# Patient Record
Sex: Male | Born: 1955 | Race: White | Hispanic: No | Marital: Married | Smoking: Current every day smoker
Health system: Southern US, Community
[De-identification: ages and names within clinical notes are randomized; demographics above are authoritative.]

## PROBLEM LIST (undated history)

## (undated) DIAGNOSIS — M797 Fibromyalgia: Secondary | ICD-10-CM

## (undated) DIAGNOSIS — D1800 Hemangioma unspecified site: Secondary | ICD-10-CM

## (undated) DIAGNOSIS — E7800 Pure hypercholesterolemia, unspecified: Secondary | ICD-10-CM

## (undated) DIAGNOSIS — I10 Essential (primary) hypertension: Secondary | ICD-10-CM

## (undated) HISTORY — PX: FACIAL COSMETIC SURGERY: SHX629

---

## 1998-08-24 ENCOUNTER — Emergency Department (HOSPITAL_COMMUNITY): Admission: EM | Admit: 1998-08-24 | Discharge: 1998-08-24 | Payer: Self-pay | Admitting: Emergency Medicine

## 2002-09-27 ENCOUNTER — Inpatient Hospital Stay (HOSPITAL_COMMUNITY): Admission: AD | Admit: 2002-09-27 | Discharge: 2002-09-29 | Payer: Self-pay | Admitting: Orthopedic Surgery

## 2005-08-18 ENCOUNTER — Emergency Department (HOSPITAL_COMMUNITY): Admission: EM | Admit: 2005-08-18 | Discharge: 2005-08-18 | Payer: Self-pay | Admitting: Emergency Medicine

## 2009-10-13 ENCOUNTER — Emergency Department (HOSPITAL_COMMUNITY): Admission: EM | Admit: 2009-10-13 | Discharge: 2009-10-13 | Payer: Self-pay | Admitting: Emergency Medicine

## 2010-10-03 NOTE — Discharge Summary (Signed)
NAME:  Jay Kelly, Jay Kelly                          ACCOUNT NO.:  [id number]   MEDICAL RECORD NO.:  [id number]                   PATIENT TYPE:  INP   LOCATION:  5017                                 FACILITY:  [facility]   PHYSICIAN:  Dionne Ano. Amanda Pea, M.D.             DATE OF BIRTH:  1956-03-12   DATE OF ADMISSION:  09/27/2002  DATE OF DISCHARGE:  09/29/2002                                 DISCHARGE SUMMARY   ADMITTING DIAGNOSES:  1. Olecranon bursitis with superimposed cellulitis, about the right upper     extremity.  2. Seasonal allergies.   PHYSICIAN:  Dionne Ano. Amanda Pea, M.D.   CONSULTS:  None.   BRIEF HPI:  Jay Kelly is a 55 year old gentleman who was seen and evaluated  per Dr. Amanda Pea for right elbow pain and redness.  The patient had no known  instance of injury and was previously seen by Dr. Georgina Pillion on 5/9, in which an  aspiration was performed and with which the patient developed worsening  erythema, soft tissue swelling, and pain.  The patient had been taking  Augmentin and Vicodin up until that juncture.  Given the fact that the  patient had unresolving cellulitis and pain about the right elbow, the plan  was to have him be admitted for IV antibiotics, close observation, and  possible I&D.   PREADMISSION LABORATORY DATA:  His hemoglobin was 13.4, hematocrit 38.3.  No  leukocytosis noted.  BMP within normal limits.   HOSPITAL COURSE:  Patient was admitted for IV antibiotics, close  observation, and possible I&D, if his symptoms progressed.  Hospital day  number 1, the patient did have significant pain and tenderness, he was noted  to have pruritis as well as episodes of nausea and vomiting.  His pain  medications, which had originally been given to him as Percocet was  discontinued.  Vicodin was begun, and his nausea and vomiting did improve.  Examination of the right upper extremity showed that the cellulitis was  improving.  He was still quite tender.  He was  neurovascularly intact.  Vital signs were stable ad afebrile.  Over the course of the next few days,  the patient continued IV antibiotics.  His condition improved.  He had  decreasing erythema to the right elbow.  His pain improved significantly.  The patient maintained afebrile status.  Vitals were stable on 09/29/2002.  Patient was noted to have decreased erythema, only warm to touch, with a  prominent bursa without abscess formation, minimally tender.   Discharge plans were discussed with the patient and he stated he had to  leave one way or another the following morning, as he needed to pick his  daughter up in [place].  Given his improved condition, the decision was made  to discharge him home.   ASSESSMENT:  olecranon bursitis with cellulitis, resolved of the right upper  extremity.   CONDITION ON DISCHARGE:  Improved.  PLAN:  Regular activity.  Patient will elevate the extremity frequently  utilizing an ACE wrap and warm, moist heat.  He is to move his fingers  frequently.  He may move the elbow as tolerated.   DISCHARGE MEDICATIONS:  Vicodin 1 tab p.o. q.4-6 p.r.n., Keflex 500 mg  q.i.d. x10 days (#40).  He will follow up with Dr. Amanda Pea the following  Thursday or Friday.  He is to call for an appointment.   PLAN:     Karie Chimera, P.A.-C.                   Dionne Ano. Amanda Pea, M.D.    BB/MEDQ  D:  11/02/2002  T:  11/04/2002  Job:  [id number]

## 2017-10-17 ENCOUNTER — Emergency Department (HOSPITAL_COMMUNITY)
Admission: EM | Admit: 2017-10-17 | Discharge: 2017-10-17 | Disposition: A | Discharge status: Home / Self Care | Payer: Medicaid Other | Attending: Emergency Medicine | Admitting: Emergency Medicine

## 2017-10-17 ENCOUNTER — Encounter (HOSPITAL_COMMUNITY): Payer: Self-pay | Admitting: Emergency Medicine

## 2017-10-17 ENCOUNTER — Other Ambulatory Visit: Payer: Self-pay

## 2017-10-17 ENCOUNTER — Emergency Department (HOSPITAL_COMMUNITY): Payer: Medicaid Other

## 2017-10-17 DIAGNOSIS — Z79899 Other long term (current) drug therapy: Secondary | ICD-10-CM | POA: Diagnosis not present

## 2017-10-17 DIAGNOSIS — R571 Hypovolemic shock: Secondary | ICD-10-CM

## 2017-10-17 DIAGNOSIS — E860 Dehydration: Secondary | ICD-10-CM | POA: Diagnosis not present

## 2017-10-17 DIAGNOSIS — R55 Syncope and collapse: Secondary | ICD-10-CM | POA: Diagnosis present

## 2017-10-17 DIAGNOSIS — F17200 Nicotine dependence, unspecified, uncomplicated: Secondary | ICD-10-CM | POA: Insufficient documentation

## 2017-10-17 LAB — COMPREHENSIVE METABOLIC PANEL
ALK PHOS: 53 U/L (ref 38–126)
ALT: 22 U/L (ref 17–63)
ANION GAP: 8 (ref 5–15)
AST: 24 U/L (ref 15–41)
Albumin: 3.8 g/dL (ref 3.5–5.0)
BILIRUBIN TOTAL: 0.3 mg/dL (ref 0.3–1.2)
BUN: 53 mg/dL — ABNORMAL HIGH (ref 6–20)
CALCIUM: 9.1 mg/dL (ref 8.9–10.3)
CO2: 20 mmol/L — ABNORMAL LOW (ref 22–32)
Chloride: 108 mmol/L (ref 101–111)
Creatinine, Ser: 2.4 mg/dL — ABNORMAL HIGH (ref 0.61–1.24)
GFR, EST AFRICAN AMERICAN: 32 mL/min — AB (ref >60)
GFR, EST NON AFRICAN AMERICAN: 28 mL/min — AB (ref >60)
GLUCOSE: 88 mg/dL (ref 65–99)
Potassium: 5.7 mmol/L — ABNORMAL HIGH (ref 3.5–5.1)
Sodium: 136 mmol/L (ref 135–145)
TOTAL PROTEIN: 6.8 g/dL (ref 6.5–8.1)

## 2017-10-17 LAB — URINALYSIS, ROUTINE W REFLEX MICROSCOPIC
BILIRUBIN URINE: NEGATIVE
Glucose, UA: 50 mg/dL — AB
Ketones, ur: NEGATIVE mg/dL
Leukocytes, UA: NEGATIVE
Nitrite: NEGATIVE
PH: 5 (ref 5.0–8.0)
Protein, ur: NEGATIVE mg/dL
SPECIFIC GRAVITY, URINE: 1.008 (ref 1.005–1.030)

## 2017-10-17 LAB — CBC WITH DIFFERENTIAL/PLATELET
Abs Immature Granulocytes: 0.1 10*3/uL (ref 0.0–0.1)
BASOS ABS: 0.1 10*3/uL (ref 0.0–0.1)
BASOS PCT: 1 %
Eosinophils Absolute: 0.5 10*3/uL (ref 0.0–0.7)
Eosinophils Relative: 5 %
HCT: 40.2 % (ref 39.0–52.0)
Hemoglobin: 13 g/dL (ref 13.0–17.0)
Immature Granulocytes: 1 %
Lymphocytes Relative: 22 %
Lymphs Abs: 2.2 10*3/uL (ref 0.7–4.0)
MCH: 31.1 pg (ref 26.0–34.0)
MCHC: 32.3 g/dL (ref 30.0–36.0)
MCV: 96.2 fL (ref 78.0–100.0)
MONO ABS: 0.7 10*3/uL (ref 0.1–1.0)
Monocytes Relative: 7 %
NEUTROS PCT: 64 %
Neutro Abs: 6.7 10*3/uL (ref 1.7–7.7)
PLATELETS: 382 10*3/uL (ref 150–400)
RBC: 4.18 MIL/uL — AB (ref 4.22–5.81)
RDW: 13.3 % (ref 11.5–15.5)
WBC: 10.3 10*3/uL (ref 4.0–10.5)

## 2017-10-17 LAB — ETHANOL: ALCOHOL ETHYL (B): 165 mg/dL — AB (ref <10)

## 2017-10-17 LAB — I-STAT CG4 LACTIC ACID, ED: Lactic Acid, Venous: 0.93 mmol/L (ref 0.5–1.9)

## 2017-10-17 MED ORDER — SODIUM CHLORIDE 0.9 % IV BOLUS
2000.0000 mL | Freq: Once | INTRAVENOUS | Status: AC
  Administered 2017-10-17: 2000 mL via INTRAVENOUS

## 2017-10-17 NOTE — ED Provider Notes (Signed)
[facility] EMERGENC[facility] DEPARTMENT Provider Note   CSN: [id number] Arrival date & time: 10/17/17  1413     History   Chief Complaint Chief Complaint  Patient presents with  . Near Syncope  . Hypotension    HPI Jay Kelly is a 62 y.o. male.  62 yo M with a chief complaint of near syncope.  The patient has been helping to clear trees for the past couple days and is felt lightheaded.  He denied chest pain or shortness of breath.  Has had a mild cough but denies fevers or chills vomiting or diarrhea.  He thinks he has had some blood in his stool intermittently for years not worsening recently.  He does drink alcohol every day.  The history is provided by the patient.  Illness  This is a new problem. The current episode started 2 days ago. The problem occurs constantly. The problem has not changed since onset.Pertinent negatives include no chest pain, no abdominal pain, no headaches and no shortness of breath. Nothing aggravates the symptoms. Nothing relieves the symptoms. He has tried nothing for the symptoms. The treatment provided no relief.    History reviewed. No pertinent past medical history.  There are no active problems to display for this patient.   History reviewed. No pertinent surgical history.      Home Medications    Prior to Admission medications   Medication Sig Start Date End Date Taking? Authorizing Provider  cyclobenzaprine (FLEXERIL) 10 MG tablet Take 10 mg by mouth 3 (three) times daily as needed for muscle spasms.   Yes [provider]  DULoxetine (CYMBALTA) 60 MG capsule Take 60 mg by mouth 2 (two) times daily. 08/18/17  Yes [provider]  gabapentin (NEURONTIN) 600 MG tablet Take 600 mg by mouth 4 (four) times daily.   Yes [provider]  gemfibrozil (LOPID) 600 MG tablet Take 600 mg by mouth 2 (two) times daily before a meal.   Yes [provider]  lisinopril (PRINIVIL,ZESTRIL) 20 MG tablet Take  20 mg by mouth daily. 09/27/17  Yes [provider]  meloxicam (MOBIC) 15 MG tablet Take 15 mg by mouth daily. 09/27/17  Yes [provider]  tiZANidine (ZANAFLEX) 4 MG tablet Take 4 mg by mouth every 8 (eight) hours as needed for muscle spasms.   Yes [provider]  traMADol (ULTRAM) 50 MG tablet Take 50-100 mg by mouth 2 (two) times daily as needed for moderate pain.   Yes [provider]    Family History No family history on file.  Social History Social History   Tobacco Use  . Smoking status: Current Every Day Smoker    Packs/day: 0.50  . Smokeless tobacco: Never Used  Substance Use Topics  . Alcohol use: Yes  . Drug use: Never     Allergies   Patient has no allergy information on record.   Review of Systems Review of Systems  Constitutional: Negative for chills and fever.  HENT: Negative for congestion and facial swelling.   Eyes: Negative for discharge and visual disturbance.  Respiratory: Negative for shortness of breath.   Cardiovascular: Negative for chest pain and palpitations.  Gastrointestinal: Negative for abdominal pain, diarrhea and vomiting.  Musculoskeletal: Negative for arthralgias and myalgias.  Skin: Negative for color change and rash.  Neurological: Positive for syncope (near) and weakness. Negative for tremors and headaches.  Psychiatric/Behavioral: Negative for confusion and dysphoric mood.     Physical Exam Updated Vital  Signs BP 116/79 (BP Location: Right Arm)   Pulse 70   Temp 97.6 F (36.4 C) (Oral)   Resp 17   Ht 5\' 7"  (1.702 m)   Wt 64.4 kg (142 lb)   SpO2 95%   BMI 22.24 kg/m   Physical Exam  Constitutional: He is oriented to person, place, and time. He appears well-developed and well-nourished.  HENT:  Head: Normocephalic and atraumatic.  Eyes: Pupils are equal, round, and reactive to light. EOM are normal.  Neck: Normal range of motion. Neck supple. No JVD present.  Cardiovascular: Normal  rate and regular rhythm. Exam reveals no gallop and no friction rub.  No murmur heard. Pulmonary/Chest: No respiratory distress. He has no wheezes.  Abdominal: He exhibits no distension and no mass. There is no tenderness. There is no rebound and no guarding.  Musculoskeletal: Normal range of motion.  Neurological: He is alert and oriented to person, place, and time.  Skin: No rash noted. No pallor.  Psychiatric: He has a normal mood and affect. His behavior is normal.  Nursing note and vitals reviewed.    ED Treatments / Results  Labs (all labs ordered are listed, but only abnormal results are displayed) Labs Reviewed  ETHANOL - Abnormal; Notable for the following components:      Result Value   Alcohol, Ethyl (B) 165 (*)    All other components within normal limits  COMPREHENSIVE METABOLIC PANEL - Abnormal; Notable for the following components:   Potassium 5.7 (*)    CO2 20 (*)    BUN 53 (*)    Creatinine, Ser 2.40 (*)    GFR calc non Af Amer 28 (*)    GFR calc Af Amer 32 (*)    All other components within normal limits  CBC WITH DIFFERENTIAL/PLATELET - Abnormal; Notable for the following components:   RBC 4.18 (*)    All other components within normal limits  URINALYSIS, ROUTINE W REFLEX MICROSCOPIC - Abnormal; Notable for the following components:   Color, Urine STRAW (*)    Glucose, UA 50 (*)    Hgb urine dipstick SMALL (*)    Bacteria, UA RARE (*)    All other components within normal limits  URINE CULTURE  I-STAT CG4 LACTIC ACID, ED  I-STAT CG4 LACTIC ACID, ED    EKG EKG Interpretation  Date/Time:  Sunday October 17 2017 14:26:21 EDT Ventricular Rate:  58 PR Interval:  142 QRS Duration: 82 QT Interval:  414 QTC Calculation: 406 R Axis:   94 Text Interpretation:  Sinus bradycardia Possible Left atrial enlargement Rightward axis Borderline ECG No old tracing to compare Confirmed by Deno Etienne [id number]) on 10/17/2017 3:08:57 PM   Radiology Dg Chest 2  View  Result Date: 10/17/2017 CLINICAL DATA:  61 year old presenting with dizziness and productive cough over the past several days. Current smoker. EXAM: CHEST - 2 VIEW COMPARISON:  None. FINDINGS: AP SEMI-ERECT and LATERAL images were obtained. Cardiomediastinal silhouette unremarkable. Lungs clear. Bronchovascular markings normal. Pulmonary vascularity normal. No visible pleural effusions. No pneumothorax. Visualized bony thorax intact. IMPRESSION: No acute cardiopulmonary disease. Electronically Signed   By: Evangeline Dakin M.D.   On: 10/17/2017 17:49    Procedures Procedures (including critical care time) Discussed smoking cessation with patient and was they were offerred resources to help stop.  Total time was 5 min CPT code 99406.   Medications Ordered in ED Medications  sodium chloride 0.9 % bolus 2,000 mL (2,000 mLs Intravenous New Bag/Given 10/17/17 1714)  Initial Impression / Assessment and Plan / ED Course  I have reviewed the triage vital signs and the nursing notes.  Pertinent labs & imaging results that were available during my care of the patient were reviewed by me and considered in my medical decision making (see chart for details).     62 yo M with a chief complaint of dizziness upon standing.  The patient was found to have a blood pressure in the 80s.  Was given some fluid in route.  He feels that he is much better and was leaving AMA but was talked back into staying by his family.  Evaluation here consistent with likely dehydration with a creatinine of 2.4 and a BUN of 53.  Patient has no prior labs for comparison.  His potassium is 5.7 EKG without any concerning findings.  I discussed the results with the patient who is feeling much better after 2 L of IV fluids.  I discussed the possibility of staying in the hospital for further hydration and evaluation for possible kidney injury.  The patient currently is declining is electing to go home and follow-up with his PCP.  He  will call her tomorrow morning.  Discharge home.  CRITICAL CARE Performed by: Cecilio Asper   Total critical care time: 35 minutes  Critical care time was exclusive of separately billable procedures and treating other patients.  Critical care was necessary to treat or prevent imminent or life-threatening deterioration.  Critical care was time spent personally by me on the following activities: development of treatment plan with patient and/or surrogate as well as nursing, discussions with consultants, evaluation of patient's response to treatment, examination of patient, obtaining history from patient or surrogate, ordering and performing treatments and interventions, ordering and review of laboratory studies, ordering and review of radiographic studies, pulse oximetry and re-evaluation of patient's condition.  The patients results and plan were reviewed and discussed.   Any x-rays performed were independently reviewed by myself.   Differential diagnosis were considered with the presenting HPI.  Medications  sodium chloride 0.9 % bolus 2,000 mL (2,000 mLs Intravenous New Bag/Given 10/17/17 1714)    Vitals:   10/17/17 1431 10/17/17 1434 10/17/17 1716 10/17/17 1809  BP:  (!) 80/58 109/68 116/79  Pulse:  (!) 58 68 70  Resp:  18 16 17   Temp:  97.6 F (36.4 C)    TempSrc:  Oral    SpO2:  100% 96% 95%  Weight: 64.4 kg (142 lb)     Height: 5\' 7"  (1.702 m)       Final diagnoses:  Dehydration  Hypovolemic shock (HCC)    Admission/ observation were discussed with the admitting physician, patient and/or family and they are comfortable with the plan.    Final Clinical Impressions(s) / ED Diagnoses   Final diagnoses:  Dehydration  Hypovolemic shock [facility])    ED Discharge Orders    None       Deno Etienne, DO 10/17/17 1815

## 2017-10-17 NOTE — ED Triage Notes (Signed)
Pt to ED via [facility] from home-- daughter called EMS because pt c/o being dizzy for a few days and feeling faint. Pt was orthostatic for ems --  Sitting BP 73/43  Standing  -- BP 60/33 Pt has been drinking etoh - continued drinking when EMS was present and was doing his EkG - pt states that he does not drink everyday.

## 2017-10-17 NOTE — ED Notes (Signed)
Pt states that he "is going to leave; I want to smoke a cigarette". Pt encouraged not to get out of bed; MD aware

## 2017-10-17 NOTE — Discharge Instructions (Addendum)
Please call the number provided to find a primary care physician.  I am not sure what the cause of your low blood pressure is.  I suspect that is most likely dehydration.  Please eat and drink as well as you can for the next couple days.  Return to the emergency department at any point that you feel that you would like to be evaluated for this.  Smoking is bad for you.  Please try and quit.  There is a phone number for help if you wanted.

## 2017-10-18 LAB — URINE CULTURE: Culture: NO GROWTH

## 2018-09-07 DIAGNOSIS — E785 Hyperlipidemia, unspecified: Secondary | ICD-10-CM | POA: Diagnosis not present

## 2018-09-07 DIAGNOSIS — F418 Other specified anxiety disorders: Secondary | ICD-10-CM | POA: Diagnosis not present

## 2018-09-07 DIAGNOSIS — M2550 Pain in unspecified joint: Secondary | ICD-10-CM | POA: Diagnosis not present

## 2018-09-07 DIAGNOSIS — G629 Polyneuropathy, unspecified: Secondary | ICD-10-CM | POA: Diagnosis not present

## 2018-09-07 DIAGNOSIS — L03114 Cellulitis of left upper limb: Secondary | ICD-10-CM | POA: Diagnosis not present

## 2018-09-07 DIAGNOSIS — I10 Essential (primary) hypertension: Secondary | ICD-10-CM | POA: Diagnosis not present

## 2018-10-05 DIAGNOSIS — I10 Essential (primary) hypertension: Secondary | ICD-10-CM | POA: Diagnosis not present

## 2018-10-05 DIAGNOSIS — G629 Polyneuropathy, unspecified: Secondary | ICD-10-CM | POA: Diagnosis not present

## 2018-10-05 DIAGNOSIS — M2550 Pain in unspecified joint: Secondary | ICD-10-CM | POA: Diagnosis not present

## 2018-10-05 DIAGNOSIS — E785 Hyperlipidemia, unspecified: Secondary | ICD-10-CM | POA: Diagnosis not present

## 2018-10-05 DIAGNOSIS — F418 Other specified anxiety disorders: Secondary | ICD-10-CM | POA: Diagnosis not present

## 2019-01-19 DIAGNOSIS — I10 Essential (primary) hypertension: Secondary | ICD-10-CM | POA: Diagnosis not present

## 2019-01-19 DIAGNOSIS — E785 Hyperlipidemia, unspecified: Secondary | ICD-10-CM | POA: Diagnosis not present

## 2019-01-19 DIAGNOSIS — K047 Periapical abscess without sinus: Secondary | ICD-10-CM | POA: Diagnosis not present

## 2019-01-19 DIAGNOSIS — Z125 Encounter for screening for malignant neoplasm of prostate: Secondary | ICD-10-CM | POA: Diagnosis not present

## 2019-01-19 DIAGNOSIS — G629 Polyneuropathy, unspecified: Secondary | ICD-10-CM | POA: Diagnosis not present

## 2019-01-19 DIAGNOSIS — F418 Other specified anxiety disorders: Secondary | ICD-10-CM | POA: Diagnosis not present

## 2019-01-19 DIAGNOSIS — M2550 Pain in unspecified joint: Secondary | ICD-10-CM | POA: Diagnosis not present

## 2019-05-06 IMAGING — DX DG CHEST 2V
2 series · 2 of 2 positions shown · non-contrast
Comparison: None.

CLINICAL DATA: 61-year-old presenting with dizziness and productive
cough over the past several days. Current smoker.

EXAM:
CHEST - 2 VIEW

[w chest lat]
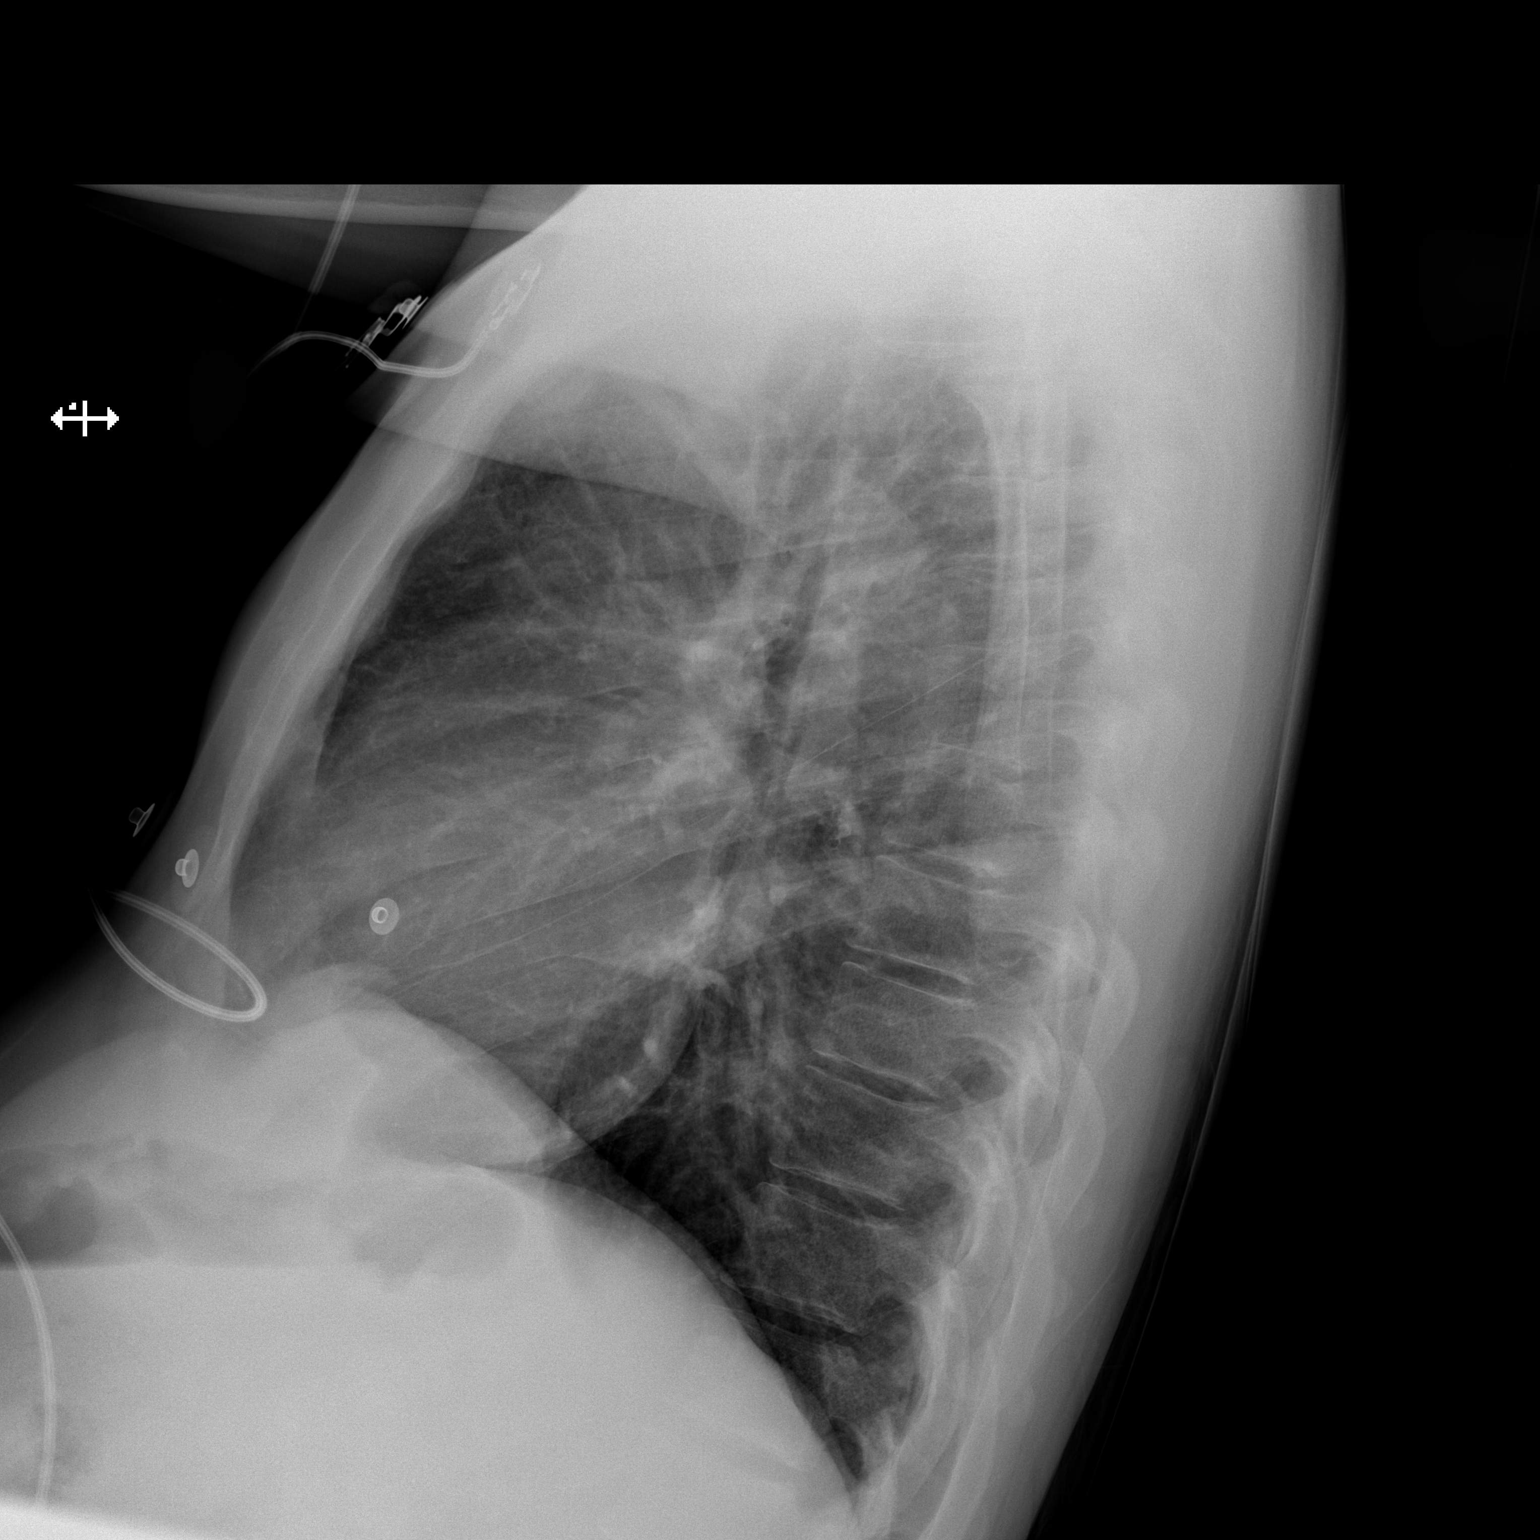

[x chest ap]
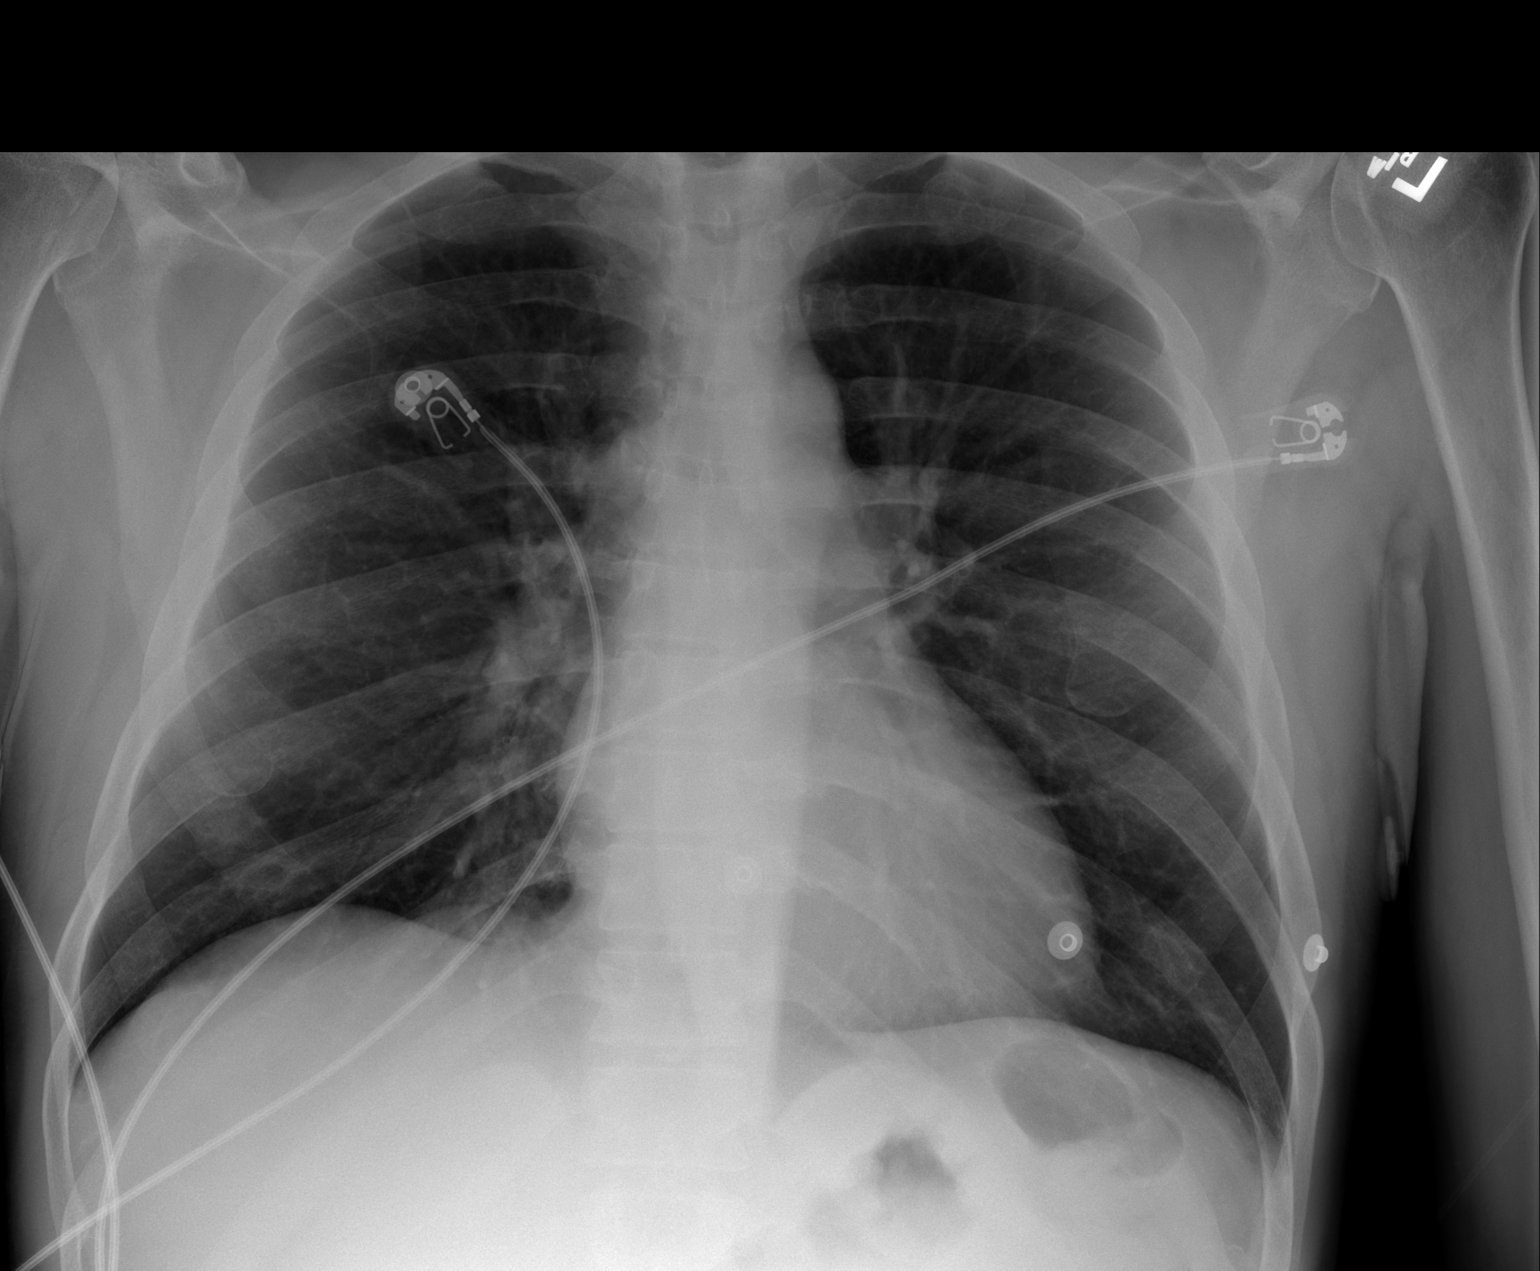

[2 of 2 positions shown; findings below may reference images not displayed]

FINDINGS: AP SEMI-ERECT and LATERAL images were obtained. Cardiomediastinal
silhouette unremarkable. Lungs clear. Bronchovascular markings
normal. Pulmonary vascularity normal. No visible pleural effusions.
No pneumothorax. Visualized bony thorax intact.
IMPRESSION: No acute cardiopulmonary disease.

## 2019-06-02 DIAGNOSIS — G629 Polyneuropathy, unspecified: Secondary | ICD-10-CM | POA: Diagnosis not present

## 2019-06-02 DIAGNOSIS — I10 Essential (primary) hypertension: Secondary | ICD-10-CM | POA: Diagnosis not present

## 2019-06-02 DIAGNOSIS — F418 Other specified anxiety disorders: Secondary | ICD-10-CM | POA: Diagnosis not present

## 2019-06-02 DIAGNOSIS — E785 Hyperlipidemia, unspecified: Secondary | ICD-10-CM | POA: Diagnosis not present

## 2019-06-02 DIAGNOSIS — M2550 Pain in unspecified joint: Secondary | ICD-10-CM | POA: Diagnosis not present

## 2019-06-02 DIAGNOSIS — H6690 Otitis media, unspecified, unspecified ear: Secondary | ICD-10-CM | POA: Diagnosis not present

## 2019-06-16 DIAGNOSIS — H6521 Chronic serous otitis media, right ear: Secondary | ICD-10-CM | POA: Diagnosis not present

## 2019-06-16 DIAGNOSIS — H6123 Impacted cerumen, bilateral: Secondary | ICD-10-CM | POA: Diagnosis not present

## 2019-06-16 DIAGNOSIS — J343 Hypertrophy of nasal turbinates: Secondary | ICD-10-CM | POA: Diagnosis not present

## 2019-06-16 DIAGNOSIS — H9011 Conductive hearing loss, unilateral, right ear, with unrestricted hearing on the contralateral side: Secondary | ICD-10-CM | POA: Diagnosis not present

## 2019-06-16 DIAGNOSIS — M950 Acquired deformity of nose: Secondary | ICD-10-CM | POA: Diagnosis not present

## 2019-06-16 DIAGNOSIS — J310 Chronic rhinitis: Secondary | ICD-10-CM | POA: Diagnosis not present

## 2019-06-19 DIAGNOSIS — R05 Cough: Secondary | ICD-10-CM | POA: Diagnosis not present

## 2019-06-19 DIAGNOSIS — G629 Polyneuropathy, unspecified: Secondary | ICD-10-CM | POA: Diagnosis not present

## 2019-06-19 DIAGNOSIS — I10 Essential (primary) hypertension: Secondary | ICD-10-CM | POA: Diagnosis not present

## 2019-06-19 DIAGNOSIS — H6690 Otitis media, unspecified, unspecified ear: Secondary | ICD-10-CM | POA: Diagnosis not present

## 2019-06-19 DIAGNOSIS — M2550 Pain in unspecified joint: Secondary | ICD-10-CM | POA: Diagnosis not present

## 2019-06-19 DIAGNOSIS — E785 Hyperlipidemia, unspecified: Secondary | ICD-10-CM | POA: Diagnosis not present

## 2019-06-19 DIAGNOSIS — F418 Other specified anxiety disorders: Secondary | ICD-10-CM | POA: Diagnosis not present

## 2019-07-17 DIAGNOSIS — H6521 Chronic serous otitis media, right ear: Secondary | ICD-10-CM | POA: Diagnosis not present

## 2019-07-17 DIAGNOSIS — H6981 Other specified disorders of Eustachian tube, right ear: Secondary | ICD-10-CM | POA: Diagnosis not present

## 2019-07-17 DIAGNOSIS — H9011 Conductive hearing loss, unilateral, right ear, with unrestricted hearing on the contralateral side: Secondary | ICD-10-CM | POA: Diagnosis not present

## 2019-07-20 DIAGNOSIS — E785 Hyperlipidemia, unspecified: Secondary | ICD-10-CM | POA: Diagnosis not present

## 2019-07-20 DIAGNOSIS — M2550 Pain in unspecified joint: Secondary | ICD-10-CM | POA: Diagnosis not present

## 2019-07-20 DIAGNOSIS — F418 Other specified anxiety disorders: Secondary | ICD-10-CM | POA: Diagnosis not present

## 2019-07-20 DIAGNOSIS — G629 Polyneuropathy, unspecified: Secondary | ICD-10-CM | POA: Diagnosis not present

## 2019-07-20 DIAGNOSIS — I10 Essential (primary) hypertension: Secondary | ICD-10-CM | POA: Diagnosis not present

## 2020-02-06 DIAGNOSIS — G629 Polyneuropathy, unspecified: Secondary | ICD-10-CM | POA: Diagnosis not present

## 2020-02-06 DIAGNOSIS — N528 Other male erectile dysfunction: Secondary | ICD-10-CM | POA: Diagnosis not present

## 2020-02-06 DIAGNOSIS — M2550 Pain in unspecified joint: Secondary | ICD-10-CM | POA: Diagnosis not present

## 2020-02-06 DIAGNOSIS — F418 Other specified anxiety disorders: Secondary | ICD-10-CM | POA: Diagnosis not present

## 2020-02-06 DIAGNOSIS — E785 Hyperlipidemia, unspecified: Secondary | ICD-10-CM | POA: Diagnosis not present

## 2020-02-06 DIAGNOSIS — I10 Essential (primary) hypertension: Secondary | ICD-10-CM | POA: Diagnosis not present

## 2020-05-28 ENCOUNTER — Encounter (HOSPITAL_COMMUNITY): Payer: Self-pay

## 2020-05-28 ENCOUNTER — Emergency Department (HOSPITAL_COMMUNITY)
Admission: EM | Admit: 2020-05-28 | Discharge: 2020-05-28 | Disposition: A | Discharge status: Home / Self Care | Payer: Medicare Other | Attending: Emergency Medicine | Admitting: Emergency Medicine

## 2020-05-28 ENCOUNTER — Other Ambulatory Visit: Payer: Self-pay

## 2020-05-28 DIAGNOSIS — F32A Depression, unspecified: Secondary | ICD-10-CM | POA: Diagnosis not present

## 2020-05-28 DIAGNOSIS — F17200 Nicotine dependence, unspecified, uncomplicated: Secondary | ICD-10-CM | POA: Diagnosis not present

## 2020-05-28 DIAGNOSIS — F10920 Alcohol use, unspecified with intoxication, uncomplicated: Secondary | ICD-10-CM

## 2020-05-28 DIAGNOSIS — F1020 Alcohol dependence, uncomplicated: Secondary | ICD-10-CM | POA: Insufficient documentation

## 2020-05-28 DIAGNOSIS — Z20822 Contact with and (suspected) exposure to covid-19: Secondary | ICD-10-CM | POA: Insufficient documentation

## 2020-05-28 DIAGNOSIS — Z79899 Other long term (current) drug therapy: Secondary | ICD-10-CM | POA: Insufficient documentation

## 2020-05-28 DIAGNOSIS — F1099 Alcohol use, unspecified with unspecified alcohol-induced disorder: Secondary | ICD-10-CM | POA: Insufficient documentation

## 2020-05-28 DIAGNOSIS — Y907 Blood alcohol level of 200-239 mg/100 ml: Secondary | ICD-10-CM | POA: Diagnosis not present

## 2020-05-28 DIAGNOSIS — Z046 Encounter for general psychiatric examination, requested by authority: Secondary | ICD-10-CM | POA: Diagnosis present

## 2020-05-28 LAB — COMPREHENSIVE METABOLIC PANEL
ALT: 32 U/L (ref 0–44)
AST: 22 U/L (ref 15–41)
Albumin: 4.7 g/dL (ref 3.5–5.0)
Alkaline Phosphatase: 56 U/L (ref 38–126)
Anion gap: 13 (ref 5–15)
BUN: 26 mg/dL — ABNORMAL HIGH (ref 8–23)
CO2: 20 mmol/L — ABNORMAL LOW (ref 22–32)
Calcium: 9 mg/dL (ref 8.9–10.3)
Chloride: 110 mmol/L (ref 98–111)
Creatinine, Ser: 0.86 mg/dL (ref 0.61–1.24)
GFR, Estimated: >60 mL/min (ref >60)
Glucose, Bld: 97 mg/dL (ref 70–99)
Potassium: 4.1 mmol/L (ref 3.5–5.1)
Sodium: 143 mmol/L (ref 135–145)
Total Bilirubin: 0.3 mg/dL (ref 0.3–1.2)
Total Protein: 8.3 g/dL — ABNORMAL HIGH (ref 6.5–8.1)

## 2020-05-28 LAB — RAPID URINE DRUG SCREEN, HOSP PERFORMED
Amphetamines: NOT DETECTED
Barbiturates: NOT DETECTED
Benzodiazepines: NOT DETECTED
Cocaine: NOT DETECTED
Opiates: NOT DETECTED
Tetrahydrocannabinol: NOT DETECTED

## 2020-05-28 LAB — ACETAMINOPHEN LEVEL: Acetaminophen (Tylenol), Serum: <10 ug/mL — ABNORMAL LOW (ref 10–30)

## 2020-05-28 LAB — CBC WITH DIFFERENTIAL/PLATELET
Abs Immature Granulocytes: 0.08 10*3/uL — ABNORMAL HIGH (ref 0.00–0.07)
Basophils Absolute: 0.2 10*3/uL — ABNORMAL HIGH (ref 0.0–0.1)
Basophils Relative: 2 %
Eosinophils Absolute: 0.5 10*3/uL (ref 0.0–0.5)
Eosinophils Relative: 4 %
HCT: 48.9 % (ref 39.0–52.0)
Hemoglobin: 16.2 g/dL (ref 13.0–17.0)
Immature Granulocytes: 1 %
Lymphocytes Relative: 29 %
Lymphs Abs: 3.8 10*3/uL (ref 0.7–4.0)
MCH: 31.3 pg (ref 26.0–34.0)
MCHC: 33.1 g/dL (ref 30.0–36.0)
MCV: 94.6 fL (ref 80.0–100.0)
Monocytes Absolute: 0.7 10*3/uL (ref 0.1–1.0)
Monocytes Relative: 5 %
Neutro Abs: 7.9 10*3/uL — ABNORMAL HIGH (ref 1.7–7.7)
Neutrophils Relative %: 59 %
Platelets: 423 10*3/uL — ABNORMAL HIGH (ref 150–400)
RBC: 5.17 MIL/uL (ref 4.22–5.81)
RDW: 13.1 % (ref 11.5–15.5)
WBC: 13.1 10*3/uL — ABNORMAL HIGH (ref 4.0–10.5)
nRBC: 0 % (ref 0.0–0.2)

## 2020-05-28 LAB — SALICYLATE LEVEL: Salicylate Lvl: <7 mg/dL — ABNORMAL LOW (ref 7.0–30.0)

## 2020-05-28 LAB — RESP PANEL BY RT-PCR (FLU A&B, COVID) ARPGX2
Influenza A by PCR: NEGATIVE
Influenza B by PCR: NEGATIVE
SARS Coronavirus 2 by RT PCR: NEGATIVE

## 2020-05-28 LAB — ETHANOL: Alcohol, Ethyl (B): 263 mg/dL — ABNORMAL HIGH (ref <10)

## 2020-05-28 NOTE — ED Notes (Signed)
Initial contact with pt, pt is in custody (IVC) pt under the influence. Pt is using foul language and  Pacing back and forth.

## 2020-05-28 NOTE — ED Provider Notes (Signed)
[facility] DEPT Provider Note   CSN: [id number] Arrival date & time: 05/28/20  0034     History Chief Complaint  Patient presents with  . IVC    Jay Kelly is a 65 y.o. male.  The history is provided by the patient and medical records.    65 year old male presenting to the ED under IVC petitioned by [employer].  Reportedly, patient has been drinking tonight and then called police and reported suicidal homicidal ideation.  Apparently there was a long standoff between patient and police, ultimately brought in in handcuffs.  Per [employer], he has a history of the same types of behaviors in the past.  History reviewed. No pertinent past medical history.  There are no problems to display for this patient.   History reviewed. No pertinent surgical history.     No family history on file.  Social History   Tobacco Use  . Smoking status: Current Every Day Smoker    Packs/day: 0.50  . Smokeless tobacco: Never Used  Vaping Use  . Vaping Use: Never used  Substance Use Topics  . Alcohol use: Yes  . Drug use: Never    Home Medications Prior to Admission medications   Medication Sig Start Date End Date Taking? Authorizing Provider  cyclobenzaprine (FLEXERIL) 10 MG tablet Take 10 mg by mouth 3 (three) times daily as needed for muscle spasms.    [provider]  DULoxetine (CYMBALTA) 60 MG capsule Take 60 mg by mouth 2 (two) times daily. 08/18/17   [provider]  gabapentin (NEURONTIN) 600 MG tablet Take 600 mg by mouth 4 (four) times daily.    [provider]  gemfibrozil (LOPID) 600 MG tablet Take 600 mg by mouth 2 (two) times daily before a meal.    [provider]  lisinopril (PRINIVIL,ZESTRIL) 20 MG tablet Take 20 mg by mouth daily. 09/27/17   [provider]  meloxicam (MOBIC) 15 MG tablet Take 15 mg by mouth daily. 09/27/17   [provider]  tiZANidine (ZANAFLEX) 4 MG tablet Take 4 mg by mouth every  8 (eight) hours as needed for muscle spasms.    [provider]  traMADol (ULTRAM) 50 MG tablet Take 50-100 mg by mouth 2 (two) times daily as needed for moderate pain.    [provider]    Allergies    Other  Review of Systems   Review of Systems  Psychiatric/Behavioral: Positive for suicidal ideas.  All other systems reviewed and are negative.   Physical Exam Updated Vital Signs BP (!) 145/94 (BP Location: Right Arm)   Pulse (!) 102   Temp 98.3 F (36.8 C)   Resp 19   SpO2 99%   Physical Exam Vitals and nursing note reviewed.  Constitutional:      Appearance: He is well-developed and well-nourished.     Comments: Appears intoxicated, belligerent  HENT:     Head: Normocephalic and atraumatic.     Mouth/Throat:     Mouth: Oropharynx is clear and moist.  Eyes:     Extraocular Movements: EOM normal.     Conjunctiva/sclera: Conjunctivae normal.     Pupils: Pupils are equal, round, and reactive to light.  Cardiovascular:     Rate and Rhythm: Normal rate and regular rhythm.     Heart sounds: Normal heart sounds.  Pulmonary:     Effort: Pulmonary effort is normal.     Breath sounds: Normal breath sounds.  Abdominal:  General: Bowel sounds are normal.     Palpations: Abdomen is soft.  Musculoskeletal:        General: Normal range of motion.     Cervical back: Normal range of motion.  Skin:    General: Skin is warm and dry.  Neurological:     Mental Status: He is alert and oriented to person, place, and time.  Psychiatric:        Mood and Affect: Mood and affect normal.     ED Results / Procedures / Treatments   Labs (all labs ordered are listed, but only abnormal results are displayed) Labs Reviewed  CBC WITH DIFFERENTIAL/PLATELET - Abnormal; Notable for the following components:      Result Value   WBC 13.1 (*)    Platelets 423 (*)    Neutro Abs 7.9 (*)    Basophils Absolute 0.2 (*)    Abs Immature Granulocytes 0.08 (*)    All other  components within normal limits  COMPREHENSIVE METABOLIC PANEL - Abnormal; Notable for the following components:   CO2 20 (*)    BUN 26 (*)    Total Protein 8.3 (*)    All other components within normal limits  ETHANOL - Abnormal; Notable for the following components:   Alcohol, Ethyl (B) 263 (*)    All other components within normal limits  SALICYLATE LEVEL - Abnormal; Notable for the following components:   Salicylate Lvl <5.3 (*)    All other components within normal limits  ACETAMINOPHEN LEVEL - Abnormal; Notable for the following components:   Acetaminophen (Tylenol), Serum <10 (*)    All other components within normal limits  RESP PANEL BY RT-PCR (FLU A&B, COVID) [id number]  RAPID URINE DRUG SCREEN, HOSP PERFORMED    EKG None  Radiology No results found.  Procedures Procedures (including critical care time)  Medications Ordered in ED Medications - No data to display  ED Course  I have reviewed the triage vital signs and the nursing notes.  Pertinent labs & imaging results that were available during my care of the patient were reviewed by me and considered in my medical decision making (see chart for details).    MDM Rules/Calculators/A&P  65 y.o. M here under IVC petitioned by [employer].  Patient was intoxicated, called reporting SI/HI.  Per [employer], he has a history of same.  Patient is intoxicated here, somewhat belligerent and not following staff instructions.  He eventually did lay down and went to sleep.  Labs as above-- ethanol 263, otherwise reassuring.  Medically cleared.  covid screen negative.   Awaiting TTS evaluation. First exam has been filed.  Final Clinical Impression(s) / ED Diagnoses Final diagnoses:  Alcoholic intoxication without complication [facility])    Rx / DC Orders ED Discharge Orders    None       Larene Pickett, PA-C 05/28/20 [place], [place], DO 05/28/20 [phone]

## 2020-05-28 NOTE — ED Notes (Signed)
Pt dressed out into burgundy scrubs and belongings which includes clothing and shoes located in cabinet behind nurses station (labeled)

## 2020-05-28 NOTE — ED Provider Notes (Signed)
65 year old male who presented to the ER intoxicated, with reports of suicidal and homicidal ideation.  Patient arrived to the ER at 00:34 AM at 05/28/2020.  Has been in the ER for over 10 hours.  Evaluated by TTS and psych cleared.  On my evaluation, patient has sobered up significantly.  Evidence of delirium tremens, withdrawal.  No tremor at baseline, answering questions appropriately, ambulating with steady gait. Calm cooperative, states he would like to go home.  Denying any SI/HI.  Stable for discharge at this time with return precautions.   Garald Balding, PA-C 05/28/20 1007    Margette Fast, MD 05/31/20 1131

## 2020-05-28 NOTE — BH Assessment (Addendum)
[facility] Assessment Progress Note  Per Shuvon Rankin, NP, this pt does not require psychiatric hospitalization at this time.  Pt presents under IVC initiated by law enforcement and upheld by [place], DO, which has been rescinded by Hampton Abbot, MD.  Pt is psychiatrically cleared.  Pt was discharged before referrals could be entered in AVS, however, a Peer Support consult has been order and they will be able to follow up with pt in the community.  Jalene Mullet, [facility] Triage Specialist [phone]

## 2020-05-28 NOTE — ED Triage Notes (Signed)
Pt arrives via [employer] after drinking alcohol and calling police endorsing suicidal and homicidal ideation. After a 2hr stand off pt arrives for evaluation. Per PD history of similar events.

## 2020-05-28 NOTE — Discharge Instructions (Addendum)
Please return to the ER for any new or worsening symptoms

## 2020-05-28 NOTE — BH Assessment (Signed)
Comprehensive Clinical Assessment (CCA) Note  05/28/2020 KALEIL HANSELL [id number]   Patient is a 65 year old male with a history of Alcohol Use Disorder, moderate who presents involuntarily to [facility] via [employer].  Patient had been drinking last night when he began calling police endorsing SI and HI.  When [employer] arrived, a 2 hour standoff ensued given patient's threats.  On arrival to the ED, patient continued to be agitated until he eventually fell asleep.    Upon assessment today, patient is calm, cooperative and pleasant.  He does not recall the events leading to this ED visit.  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  He eventually had this tumor (8-10 inches wide on right side of face) reduced, however he still has a sizeable amount remaining on lower jaw area.  He receives disability income and hasn't worked "in years."  He lives with his brother and describes their relationship as a bit "rocky and difficult sometimes."  He states he and his ex-wife have maintained a friendship and he describes her as supportive.  Patient denies SI, HI and AVH.  He denies having a substance use problem, and doesn't feel he needs treatment.  He states he would like to be at home in his bed resting and hopes to discharge soon. He gives consent for LPC to contact his brother for collateral information.   LPC spoke with patient's brother, Barnabas Lister, who expresses concern that patient "needs to be held for some kind of treatment."  He states patient "gets really drunk when he drinks."  Barnabas Lister shares his frustration about the incident  last night, after he was "taken outside in my underwear and handcuffed."  He also shares that officers had guns and "AR-15s pointed at [place] and I was just in my bed resting."  He states he is tired of dealing with issues related to patient's alcohol use problems.  He has tried to encourage patient to seek SA  treatment recently, however patient has refused.  Barnabas Lister recalls a time he and other family staged an intervention 15 or more years ago.  Patient did enter a treatment program after this intervention.  Barnabas Lister would consider another intervention, however he feels patient wouldn't be open to hearing their concerns.  He is now considering other housing options, especially after "what he put me though last night."  Barnabas Lister expresses frustration with the idea that patient may not be forced to enter SA treatment.    Disposition: Per Earleen Newport, NP patient does not meet criteria for inpatient treatment.  A peer support consult has been ordered.  Once patient meets with Peer Support, he is clear for discharge.    Chief Complaint:  Chief Complaint  Patient presents with  . IVC  . Alcohol Problem    Intoxicated - BAL 263 on arrival   Visit Diagnosis: Alcohol Use Disorder, moderate                             R/O Depressive Disorder Unspecified    CCA Screening, Triage and Referral (STR)  Patient Reported Information How did you hear about [place]? Legal System  Referral name: Patient presented under IVC initiated by [employer].  Referral phone number: No data recorded  Whom do you see for routine medical problems? I don't have a doctor  Practice/Facility Name: No data recorded Practice/Facility Phone Number: No data recorded Name of Contact: No data recorded Contact Number: No data recorded Contact Fax Number: No data recorded Prescriber Name: No data recorded Prescriber Address (if known): No data recorded  What Is the Reason for Your Visit/Call Today? Patient presents under IVC after calling police and  endorsing SI/HI.  How Long Has This Been Causing You Problems? > than 6 months  What Do You Feel Would Help You the Most Today? Therapy; Assessment Only   Have You Recently Been in Any Inpatient Treatment (Hospital/Detox/Crisis Center/28-Day Program)? No  Name/Location of Program/Hospital:No data recorded How Long Were You There? No data recorded When Were You Discharged? No data recorded  Have You Ever Received Services From [facility] Before? No  Who Do You See at [facility]? No data recorded  Have You Recently Had Any Thoughts About Hurting Yourself? No  Are You Planning to Commit Suicide/Harm Yourself At This time? No   Have you Recently Had Thoughts About [place]? No  Explanation: No data recorded  Have You Used Any Alcohol or Drugs in the Past 24 Hours? Yes  How Long Ago Did You Use Drugs or Alcohol? 0000 (last night)  What Did You Use and How Much? Uncertain - possibly 1/2 gallon whiskey   Do You Currently Have a Therapist/Psychiatrist? No  Name of Therapist/Psychiatrist: No data recorded  Have You Been Recently Discharged From Any Office Practice or Programs? No  Explanation of Discharge From Practice/Program: No data recorded    CCA Screening Triage Referral Assessment Type of Contact: Tele-Assessment  Is this Initial or Reassessment? Initial Assessment  Date Telepsych consult ordered in [facility]:  05/28/2020  Time Telepsych consult ordered in [facility]:  [place]   Patient Reported Information Reviewed? Yes  Patient Left Without Being Seen? No data recorded Reason for Not Completing Assessment: No data recorded  Collateral Involvement: Patient's brother Barnabas Lister provided collateral.   Does Patient Have a [place]? No data recorded Name and Contact of Legal Guardian: No data recorded If Minor and Not Living with Parent(s), Who has Custody? No data recorded Is CPS involved or ever been involved? Never  Is APS involved or ever  been involved? Never   Patient Determined To Be At Risk for Harm To Self or Others Based on Review of Patient Reported Information or Presenting Complaint? No  Method: No data recorded Availability of Means: No data recorded Intent: No data recorded Notification Required: No data recorded Additional Information for Danger to Others Potential: No data recorded Additional Comments for Danger to Others Potential: No data recorded Are There Guns or Other Weapons in Your Home? No data recorded Types of Guns/Weapons: No data recorded Are These Weapons Safely Secured?                            No data recorded Who Could Verify You Are Able To Have These Secured: No data recorded Do You Have any Outstanding Charges, Pending Court Dates, Parole/Probation? No data recorded Contacted To Inform of Risk of Harm To Self or Others: No data recorded  Location of Assessment: [facility] ED   Does Patient Present under Involuntary  Commitment? Yes  IVC Papers Initial File Date: 05/27/2020   [place] of Residence: [place]   Patient Currently Receiving the Following Services: Not Receiving Services   Determination of Need: Routine (7 days)   Options For Referral: Chemical Dependency Intensive Outpatient Therapy (CDIOP); Outpatient Therapy     CCA Biopsychosocial Intake/Chief Complaint:  Patient presents under IVC after calling police, intoxicated and endorsing SI/HI.  Current Symptoms/Problems: Patient doesn't recall the incident last night.  He denies having an alcohol addiction and is not open to treatment.  He does admits to "drinking too much sometimes."   Patient Reported Schizophrenia/Schizoaffective Diagnosis in Past: No   Strengths: Has support  Preferences: No data recorded Abilities: No data recorded  Type of Services Patient Feels are Needed: Denies need for treatment   Initial Clinical Notes/Concerns: No data recorded  Mental Health Symptoms Depression:  Irritability    Duration of Depressive symptoms: Greater than two weeks   Mania:  None   Anxiety:   Worrying   Psychosis:  None   Duration of Psychotic symptoms: No data recorded  Trauma:  None   Obsessions:  None   Compulsions:  None   Inattention:  N/A   Hyperactivity/Impulsivity:  N/A   Oppositional/Defiant Behaviors:  N/A   Emotional Irregularity:  No data recorded  Other Mood/Personality Symptoms:  Alcohol induced agitation, to include threatening behavior.    Mental Status Exam Appearance and self-care  Stature:  Average   Weight:  Average weight   Clothing:  Casual   Grooming:  Normal   Cosmetic use:  None   Posture/gait:  Normal   Motor activity:  Not Remarkable   Sensorium  Attention:  Normal   Concentration:  Normal   Orientation:  Place; Person; Object; Time   Recall/memory:  Defective in Recent (related to ETOH intoxication)   Affect and Mood  Affect:  Appropriate   Mood:  Euthymic   Relating  Eye contact:  Normal   Facial expression:  Responsive   Attitude toward examiner:  Cooperative   Thought and Language  Speech flow: Clear and Coherent   Thought content:  Appropriate to Mood and Circumstances   Preoccupation:  None   Hallucinations:  None   Organization:  No data recorded  [employer] of Knowledge:  Average   Intelligence:  Average   Abstraction:  Normal   Judgement:  Fair   Art therapist:  Adequate   Insight:  Lacking   Decision Making:  Impulsive   Social Functioning  Social Maturity:  Irresponsible   Social Judgement:  Naive   Stress  Stressors:  Family conflict; Relationship   Coping Ability:  Overwhelmed   Skill Deficits:  Interpersonal; Responsibility; Self-control   Supports:  Family; Friends/Service system     Religion: Religion/Spirituality Are You A Religious Person?: No  Leisure/Recreation: Leisure / Recreation Do You Have Hobbies?: Yes Leisure and Hobbies: watch  TV  Exercise/Diet: Exercise/Diet Do You Exercise?: No Have You Gained or Lost A Significant Amount of Weight in the Past Six Months?: No Do You Follow a Special Diet?: No Do You Have Any Trouble Sleeping?: Yes Explanation of Sleeping Difficulties: "always had issues with sleep"   CCA Employment/Education Employment/Work Situation: Employment / Work Situation Employment situation: On disability Why is patient on disability: Hx of tumor at 71 mos of age- continued complications related to continued growth and removal. How long has patient been on disability: unknown What is the longest time patient has a held a job?:  Unknown Where was the patient employed at that time?: [employer] Has patient ever been in the [employer]?: No  Education: Education Is Patient Currently Attending School?: No Last Grade Completed: 12 Did Teacher, adult education From [employer]?: No Did [place]?: No Did [place]?: No Did You Have An Individualized Education Program (IIEP): No Did You Have Any Difficulty At School?: No Patient's Education Has Been Impacted by Current Illness: No   CCA Family/Childhood History Family and Relationship History: Family history Marital status: Divorced Divorced, when?: UTA What types of issues is patient dealing with in the relationship?: None reported - describes ex wife as supportive Additional relationship information: N/A What is your sexual orientation?: UTA Has your sexual activity been affected by drugs, alcohol, medication, or emotional stress?: UTA Does patient have children?: Yes How many children?: 2 How is patient's relationship with their children?: One daughter is a heroin addict and not doing well.  Pt doesn't talk to other daughter much.  Childhood History:  Childhood History By whom was/is the patient raised?: Both parents Additional childhood history information: No issues reported Description of patient's relationship with caregiver  when they were a child: No problems mentioned Patient's description of current relationship with people who raised him/her: [employer] How were you disciplined when you got in trouble as a child/adolescent?: UTA Does patient have siblings?: Yes Number of Siblings: 1 Description of patient's current relationship with siblings: Lives with brother, who is supportive. Did patient suffer any verbal/emotional/physical/sexual abuse as a child?: No Did patient suffer from severe childhood neglect?: No Has patient ever been sexually abused/assaulted/raped as an adolescent or adult?: No Was the patient ever a victim of a crime or a disaster?: No Witnessed domestic violence?: No Has patient been affected by domestic violence as an adult?: No  Child/Adolescent Assessment:     CCA Substance Use Alcohol/Drug Use: Alcohol / Drug Use Pain Medications: See MAR Prescriptions: See MAR Over the Counter: See MAR History of alcohol / drug use?: Yes Longest period of sobriety (when/how long): N/A - denies daily use Negative Consequences of Use: Financial,Personal relationships Withdrawal Symptoms:  (None - BAL likely 160s - 263 on arrival) Substance #1 Name of Substance 1: ETOH 1 - Age of First Use: teens 1 - Amount (size/oz): 1/2 gallon 1 - Frequency: once per week 1 - Duration: "years" 1 - Last Use / Amount: yesterday - 1/2 gallon     ASAM's:  Six Dimensions of Multidimensional Assessment  Dimension 1:  Acute Intoxication and/or Withdrawal Potential:   Dimension 1:  Description of individual's past and current experiences of substance use and withdrawal: Denies hx of significant w/d symptoms  Dimension 2:  Biomedical Conditions and Complications:   Dimension 2:  Description of patient's biomedical conditions and  complications: Adequately copes with discomfort given his medical hx.  Dimension 3:  Emotional, Behavioral, or Cognitive Conditions and Complications:  Dimension 3:  Description of emotional,  behavioral, or cognitive conditions and complications: Depression associated with medical condition, although patient denies significant depression.  Dimension 4:  Readiness to Change:  Dimension 4:  Description of Readiness to Change criteria: Does not feel he has a problem with alcohol and states he "will continue to drink when I choose to."  Dimension 5:  Relapse, Continued use, or Continued Problem Potential:  Dimension 5:  Relapse, continued use, or continued problem potential critiera description: Poor insight into condition  Dimension 6:  Recovery/Living Environment:  Dimension 6:  Recovery/Iiving environment criteria description: Brother  is supportive and has encouraged pt to enter SA treatment.  ASAM Severity Score: ASAM's Severity Rating Score: 8  ASAM Recommended Level of Treatment: ASAM Recommended Level of Treatment: Level II Intensive Outpatient Treatment   Substance use Disorder (SUD) Substance Use Disorder (SUD)  Checklist Symptoms of Substance Use: Continued use despite having a persistent/recurrent physical/psychological problem caused/exacerbated by use,Continued use despite persistent or recurrent social, interpersonal problems, caused or exacerbated by use  Recommendations for Services/Supports/Treatments: Recommendations for Services/Supports/Treatments Recommendations For Services/Supports/Treatments: Peer Support,CD-IOP Intensive Chemical Dependency Program,Individual Therapy  DSM5 Diagnoses: There are no problems to display for this patient.   Patient Centered Plan: Patient is on the following Treatment Plan(s):  Substance Abuse   Referrals to Alternative Service(s): Referred to Alternative Service(s):   Place:   Date:   Time:    Referred to Alternative Service(s):   Place:   Date:   Time:    Referred to Alternative Service(s):   Place:   Date:   Time:    Referred to Alternative Service(s):   Place:   Date:   Time:     Fransico Meadow, [facility]

## 2020-05-28 NOTE — ED Notes (Signed)
TTS consult in progress. °

## 2020-10-18 DIAGNOSIS — Z125 Encounter for screening for malignant neoplasm of prostate: Secondary | ICD-10-CM | POA: Diagnosis not present

## 2020-10-18 DIAGNOSIS — Z131 Encounter for screening for diabetes mellitus: Secondary | ICD-10-CM | POA: Diagnosis not present

## 2020-10-18 DIAGNOSIS — E785 Hyperlipidemia, unspecified: Secondary | ICD-10-CM | POA: Diagnosis not present

## 2020-10-18 DIAGNOSIS — G629 Polyneuropathy, unspecified: Secondary | ICD-10-CM | POA: Diagnosis not present

## 2020-10-18 DIAGNOSIS — I10 Essential (primary) hypertension: Secondary | ICD-10-CM | POA: Diagnosis not present

## 2020-10-18 DIAGNOSIS — M2550 Pain in unspecified joint: Secondary | ICD-10-CM | POA: Diagnosis not present

## 2020-10-18 DIAGNOSIS — F418 Other specified anxiety disorders: Secondary | ICD-10-CM | POA: Diagnosis not present

## 2020-10-18 DIAGNOSIS — Z1389 Encounter for screening for other disorder: Secondary | ICD-10-CM | POA: Diagnosis not present

## 2020-10-18 DIAGNOSIS — H6501 Acute serous otitis media, right ear: Secondary | ICD-10-CM | POA: Diagnosis not present

## 2020-10-18 DIAGNOSIS — N528 Other male erectile dysfunction: Secondary | ICD-10-CM | POA: Diagnosis not present

## 2020-12-03 DIAGNOSIS — H6504 Acute serous otitis media, recurrent, right ear: Secondary | ICD-10-CM | POA: Diagnosis not present

## 2020-12-03 DIAGNOSIS — E785 Hyperlipidemia, unspecified: Secondary | ICD-10-CM | POA: Diagnosis not present

## 2020-12-03 DIAGNOSIS — G629 Polyneuropathy, unspecified: Secondary | ICD-10-CM | POA: Diagnosis not present

## 2020-12-03 DIAGNOSIS — N528 Other male erectile dysfunction: Secondary | ICD-10-CM | POA: Diagnosis not present

## 2020-12-03 DIAGNOSIS — M2550 Pain in unspecified joint: Secondary | ICD-10-CM | POA: Diagnosis not present

## 2020-12-03 DIAGNOSIS — Z720 Tobacco use: Secondary | ICD-10-CM | POA: Diagnosis not present

## 2020-12-03 DIAGNOSIS — F418 Other specified anxiety disorders: Secondary | ICD-10-CM | POA: Diagnosis not present

## 2020-12-03 DIAGNOSIS — I10 Essential (primary) hypertension: Secondary | ICD-10-CM | POA: Diagnosis not present

## 2021-01-01 DIAGNOSIS — Z23 Encounter for immunization: Secondary | ICD-10-CM | POA: Diagnosis not present

## 2021-01-09 DIAGNOSIS — H6521 Chronic serous otitis media, right ear: Secondary | ICD-10-CM | POA: Diagnosis not present

## 2021-01-09 DIAGNOSIS — H6121 Impacted cerumen, right ear: Secondary | ICD-10-CM | POA: Diagnosis not present

## 2021-01-09 DIAGNOSIS — H9011 Conductive hearing loss, unilateral, right ear, with unrestricted hearing on the contralateral side: Secondary | ICD-10-CM | POA: Diagnosis not present

## 2021-09-04 DIAGNOSIS — H9011 Conductive hearing loss, unilateral, right ear, with unrestricted hearing on the contralateral side: Secondary | ICD-10-CM | POA: Diagnosis not present

## 2021-09-04 DIAGNOSIS — H6981 Other specified disorders of Eustachian tube, right ear: Secondary | ICD-10-CM | POA: Diagnosis not present

## 2021-09-04 DIAGNOSIS — H6521 Chronic serous otitis media, right ear: Secondary | ICD-10-CM | POA: Diagnosis not present

## 2021-11-24 ENCOUNTER — Ambulatory Visit (HOSPITAL_COMMUNITY)
Admission: EM | Admit: 2021-11-24 | Discharge: 2021-11-24 | Disposition: A | Discharge status: Other Acute Inpatient Hospital | Payer: Medicare Other

## 2021-11-24 ENCOUNTER — Encounter (HOSPITAL_COMMUNITY): Payer: Self-pay

## 2021-11-24 ENCOUNTER — Inpatient Hospital Stay (HOSPITAL_COMMUNITY)
Admission: EM | Admit: 2021-11-24 | Discharge: 2021-11-29 | DRG: 034 | Disposition: A | Discharge status: Home / Self Care | Payer: Medicare Other | Attending: Internal Medicine | Admitting: Internal Medicine

## 2021-11-24 ENCOUNTER — Emergency Department (HOSPITAL_COMMUNITY): Payer: Medicare Other

## 2021-11-24 DIAGNOSIS — E785 Hyperlipidemia, unspecified: Secondary | ICD-10-CM | POA: Diagnosis not present

## 2021-11-24 DIAGNOSIS — Z91148 Patient's other noncompliance with medication regimen for other reason: Secondary | ICD-10-CM

## 2021-11-24 DIAGNOSIS — I771 Stricture of artery: Secondary | ICD-10-CM | POA: Diagnosis not present

## 2021-11-24 DIAGNOSIS — E7800 Pure hypercholesterolemia, unspecified: Secondary | ICD-10-CM | POA: Diagnosis present

## 2021-11-24 DIAGNOSIS — R29810 Facial weakness: Secondary | ICD-10-CM | POA: Diagnosis present

## 2021-11-24 DIAGNOSIS — I63511 Cerebral infarction due to unspecified occlusion or stenosis of right middle cerebral artery: Secondary | ICD-10-CM | POA: Diagnosis present

## 2021-11-24 DIAGNOSIS — Z79899 Other long term (current) drug therapy: Secondary | ICD-10-CM

## 2021-11-24 DIAGNOSIS — R531 Weakness: Secondary | ICD-10-CM | POA: Diagnosis not present

## 2021-11-24 DIAGNOSIS — R471 Dysarthria and anarthria: Secondary | ICD-10-CM | POA: Diagnosis present

## 2021-11-24 DIAGNOSIS — I639 Cerebral infarction, unspecified: Secondary | ICD-10-CM | POA: Diagnosis not present

## 2021-11-24 DIAGNOSIS — R278 Other lack of coordination: Secondary | ICD-10-CM | POA: Diagnosis present

## 2021-11-24 DIAGNOSIS — G8194 Hemiplegia, unspecified affecting left nondominant side: Secondary | ICD-10-CM | POA: Diagnosis present

## 2021-11-24 DIAGNOSIS — I63239 Cerebral infarction due to unspecified occlusion or stenosis of unspecified carotid arteries: Secondary | ICD-10-CM

## 2021-11-24 DIAGNOSIS — M797 Fibromyalgia: Secondary | ICD-10-CM | POA: Diagnosis present

## 2021-11-24 DIAGNOSIS — I10 Essential (primary) hypertension: Secondary | ICD-10-CM | POA: Diagnosis not present

## 2021-11-24 DIAGNOSIS — H0520 Unspecified exophthalmos: Secondary | ICD-10-CM | POA: Diagnosis not present

## 2021-11-24 DIAGNOSIS — I63231 Cerebral infarction due to unspecified occlusion or stenosis of right carotid arteries: Principal | ICD-10-CM | POA: Diagnosis present

## 2021-11-24 DIAGNOSIS — M4802 Spinal stenosis, cervical region: Secondary | ICD-10-CM | POA: Diagnosis not present

## 2021-11-24 DIAGNOSIS — R29702 NIHSS score 2: Secondary | ICD-10-CM | POA: Diagnosis present

## 2021-11-24 DIAGNOSIS — F17210 Nicotine dependence, cigarettes, uncomplicated: Secondary | ICD-10-CM | POA: Diagnosis not present

## 2021-11-24 DIAGNOSIS — G451 Carotid artery syndrome (hemispheric): Secondary | ICD-10-CM | POA: Diagnosis not present

## 2021-11-24 DIAGNOSIS — IMO0001 Reserved for inherently not codable concepts without codable children: Secondary | ICD-10-CM | POA: Diagnosis present

## 2021-11-24 DIAGNOSIS — I6523 Occlusion and stenosis of bilateral carotid arteries: Secondary | ICD-10-CM | POA: Diagnosis not present

## 2021-11-24 DIAGNOSIS — I739 Peripheral vascular disease, unspecified: Secondary | ICD-10-CM | POA: Diagnosis not present

## 2021-11-24 DIAGNOSIS — F17200 Nicotine dependence, unspecified, uncomplicated: Secondary | ICD-10-CM | POA: Diagnosis present

## 2021-11-24 DIAGNOSIS — I6521 Occlusion and stenosis of right carotid artery: Secondary | ICD-10-CM | POA: Diagnosis not present

## 2021-11-24 DIAGNOSIS — I63131 Cerebral infarction due to embolism of right carotid artery: Secondary | ICD-10-CM | POA: Diagnosis not present

## 2021-11-24 DIAGNOSIS — I6389 Other cerebral infarction: Secondary | ICD-10-CM | POA: Diagnosis not present

## 2021-11-24 DIAGNOSIS — R2990 Unspecified symptoms and signs involving the nervous system: Secondary | ICD-10-CM

## 2021-11-24 HISTORY — DX: Pure hypercholesterolemia, unspecified: E78.00

## 2021-11-24 HISTORY — DX: Essential (primary) hypertension: I10

## 2021-11-24 HISTORY — DX: Hemangioma unspecified site: D18.00

## 2021-11-24 HISTORY — DX: Fibromyalgia: M79.7

## 2021-11-24 LAB — URINALYSIS, ROUTINE W REFLEX MICROSCOPIC
Bilirubin Urine: NEGATIVE
Glucose, UA: NEGATIVE mg/dL
Hgb urine dipstick: NEGATIVE
Ketones, ur: NEGATIVE mg/dL
Nitrite: NEGATIVE
Protein, ur: NEGATIVE mg/dL
Specific Gravity, Urine: 1.025 (ref 1.005–1.030)
pH: 5 (ref 5.0–8.0)

## 2021-11-24 LAB — DIFFERENTIAL
Abs Immature Granulocytes: 0.03 10*3/uL (ref 0.00–0.07)
Basophils Absolute: 0.1 10*3/uL (ref 0.0–0.1)
Basophils Relative: 1 %
Eosinophils Absolute: 0.6 10*3/uL — ABNORMAL HIGH (ref 0.0–0.5)
Eosinophils Relative: 7 %
Immature Granulocytes: 0 %
Lymphocytes Relative: 27 %
Lymphs Abs: 2.5 10*3/uL (ref 0.7–4.0)
Monocytes Absolute: 0.6 10*3/uL (ref 0.1–1.0)
Monocytes Relative: 6 %
Neutro Abs: 5.6 10*3/uL (ref 1.7–7.7)
Neutrophils Relative %: 59 %

## 2021-11-24 LAB — COMPREHENSIVE METABOLIC PANEL WITH GFR
ALT: 22 U/L (ref 0–44)
AST: 18 U/L (ref 15–41)
Albumin: 3.6 g/dL (ref 3.5–5.0)
Alkaline Phosphatase: 65 U/L (ref 38–126)
Anion gap: 9 (ref 5–15)
BUN: 14 mg/dL (ref 8–23)
CO2: 23 mmol/L (ref 22–32)
Calcium: 9.3 mg/dL (ref 8.9–10.3)
Chloride: 105 mmol/L (ref 98–111)
Creatinine, Ser: 1.03 mg/dL (ref 0.61–1.24)
GFR, Estimated: >60 mL/min
Glucose, Bld: 126 mg/dL — ABNORMAL HIGH (ref 70–99)
Potassium: 4.2 mmol/L (ref 3.5–5.1)
Sodium: 137 mmol/L (ref 135–145)
Total Bilirubin: 0.4 mg/dL (ref 0.3–1.2)
Total Protein: 6.7 g/dL (ref 6.5–8.1)

## 2021-11-24 LAB — ETHANOL: Alcohol, Ethyl (B): <10 mg/dL

## 2021-11-24 LAB — CBC
HCT: 43.8 % (ref 39.0–52.0)
Hemoglobin: 14.8 g/dL (ref 13.0–17.0)
MCH: 31 pg (ref 26.0–34.0)
MCHC: 33.8 g/dL (ref 30.0–36.0)
MCV: 91.8 fL (ref 80.0–100.0)
Platelets: 276 K/uL (ref 150–400)
RBC: 4.77 MIL/uL (ref 4.22–5.81)
RDW: 12.7 % (ref 11.5–15.5)
WBC: 9.4 K/uL (ref 4.0–10.5)
nRBC: 0 % (ref 0.0–0.2)

## 2021-11-24 LAB — PROTIME-INR
INR: 1.1 (ref 0.8–1.2)
Prothrombin Time: 13.7 s (ref 11.4–15.2)

## 2021-11-24 LAB — RAPID URINE DRUG SCREEN, HOSP PERFORMED
Amphetamines: NOT DETECTED
Barbiturates: NOT DETECTED
Benzodiazepines: NOT DETECTED
Cocaine: NOT DETECTED
Opiates: NOT DETECTED
Tetrahydrocannabinol: NOT DETECTED

## 2021-11-24 LAB — APTT: aPTT: 28 seconds (ref 24–36)

## 2021-11-24 NOTE — ED Notes (Signed)
Patient is being discharged from the Urgent Care and sent to the Emergency Department via POV . Per R Rising PA, patient is in need of higher level of care due to Stroke like sx. Patient is aware and verbalizes understanding of plan of care.  Vitals:   11/24/21 1709  BP: (!) 154/87  Pulse: 70  Resp: 14  Temp: 97.9 F (36.6 C)  SpO2: 98%

## 2021-11-24 NOTE — ED Triage Notes (Signed)
Pt presents with c/o of vision changes, l;eft sided facial and arm tinging, left arm weakness that began on Friday. Pt states he thinks he had a stroke but chose to not seek medical care until today

## 2021-11-24 NOTE — ED Provider Notes (Signed)
Presents to urgent care with blurry vision and left sided arm tingling/weakness. Symptoms began Friday, reports improvement today but still persistent. Patient states he may have had a stroke.  No personal history of stroke, mother died of one.  Did fall 1 week ago and hit head, no LOC. Not on blood thinner.  Recommend patient be seen in the emergency department for evaluation. Concerning symptoms. Most likely needs CT/MRI. Patient understands we do not have these imaging capabilities at the urgent care and I strongly recommend he go to the ED. Declines EMS transport. Discharged to ED via POV in stable condition considering the resources available at [facility].   Kyra Leyland 11/24/21 1731

## 2021-11-24 NOTE — ED Triage Notes (Signed)
Pt reports left arm numbness that started on Friday associated with headache and generalized weakness and blurry vision. Denies trouble with speech or walking. A&ox4, ambulatory at triage with independent steady gait.

## 2021-11-24 NOTE — Discharge Instructions (Addendum)
D/C to ED via POV

## 2021-11-24 NOTE — ED Provider Triage Note (Signed)
Emergency Medicine Provider Triage Evaluation Note  Jay Kelly , a 66 y.o. male  was evaluated in triage.  Pt complains of left arm numbness and headache.  This started on Friday night. He hasn't hit his head recently.   His daughter feels like he has had some slight behavior changes recently. That he has been confused with repetitive questioning. Patient feels like he had numbness in his left ear, and weakness in his left arm with numbness.  He denies any symptoms in his lower extremities. Physical Exam  BP (!) 158/108 (BP Location: Right Arm)   Pulse 73   Temp 98.6 F (37 C) (Oral)   Resp 18   Ht '5\' 8"'$  (1.727 m)   Wt 68 kg   SpO2 99%   BMI 22.81 kg/m  Gen:   Awake, no distress   Resp:  Normal effort  MSK:   Moves extremities without difficulty  Other:  Left-sided pronator drift is present. Patient and daughter report his facial movements are at his baseline.  He has a history of facial cosmetic surgery when he was 82 months old due to a angioma per his report.  He denies any vision changes.  Medical Decision Making  Medically screening exam initiated at 6:11 PM.  Appropriate orders placed.  AVYAY COGER was informed that the remainder of the evaluation will be completed by another provider, this initial triage assessment does not replace that evaluation, and the importance of remaining in the ED until their evaluation is complete.  Patient is far outside any stroke window. He does have a pronator drift. I discussed with him and his daughter the importance of staying for full evaluation, along with that if he has any strokelike symptoms in the future that he needs to immediately call 911.  He states his understanding.    Lorin Glass, [place] 11/24/21 1823

## 2021-11-25 ENCOUNTER — Emergency Department (HOSPITAL_COMMUNITY): Payer: Medicare Other

## 2021-11-25 ENCOUNTER — Observation Stay (HOSPITAL_COMMUNITY): Payer: Medicare Other

## 2021-11-25 ENCOUNTER — Other Ambulatory Visit: Payer: Self-pay

## 2021-11-25 ENCOUNTER — Encounter (HOSPITAL_COMMUNITY): Payer: Self-pay

## 2021-11-25 ENCOUNTER — Observation Stay (HOSPITAL_BASED_OUTPATIENT_CLINIC_OR_DEPARTMENT_OTHER): Payer: Medicare Other

## 2021-11-25 DIAGNOSIS — I6523 Occlusion and stenosis of bilateral carotid arteries: Secondary | ICD-10-CM | POA: Diagnosis not present

## 2021-11-25 DIAGNOSIS — M4802 Spinal stenosis, cervical region: Secondary | ICD-10-CM | POA: Diagnosis not present

## 2021-11-25 DIAGNOSIS — I10 Essential (primary) hypertension: Secondary | ICD-10-CM | POA: Diagnosis present

## 2021-11-25 DIAGNOSIS — I639 Cerebral infarction, unspecified: Secondary | ICD-10-CM | POA: Diagnosis not present

## 2021-11-25 DIAGNOSIS — F17200 Nicotine dependence, unspecified, uncomplicated: Secondary | ICD-10-CM | POA: Diagnosis present

## 2021-11-25 DIAGNOSIS — E785 Hyperlipidemia, unspecified: Secondary | ICD-10-CM | POA: Diagnosis not present

## 2021-11-25 DIAGNOSIS — Z91148 Patient's other noncompliance with medication regimen for other reason: Secondary | ICD-10-CM | POA: Diagnosis not present

## 2021-11-25 DIAGNOSIS — I6521 Occlusion and stenosis of right carotid artery: Secondary | ICD-10-CM

## 2021-11-25 DIAGNOSIS — I6389 Other cerebral infarction: Secondary | ICD-10-CM

## 2021-11-25 DIAGNOSIS — I771 Stricture of artery: Secondary | ICD-10-CM | POA: Diagnosis not present

## 2021-11-25 DIAGNOSIS — H0520 Unspecified exophthalmos: Secondary | ICD-10-CM | POA: Diagnosis not present

## 2021-11-25 LAB — LIPID PANEL
Cholesterol: 192 mg/dL (ref 0–200)
HDL: 37 mg/dL — ABNORMAL LOW (ref >40)
LDL Cholesterol: 128 mg/dL — ABNORMAL HIGH (ref 0–99)
Total CHOL/HDL Ratio: 5.2 RATIO
Triglycerides: 134 mg/dL (ref <150)
VLDL: 27 mg/dL (ref 0–40)

## 2021-11-25 LAB — ECHOCARDIOGRAM COMPLETE
Height: 68 in
S' Lateral: 2.8 cm
Weight: 2400 oz

## 2021-11-25 LAB — HIV ANTIBODY (ROUTINE TESTING W REFLEX): HIV Screen 4th Generation wRfx: NONREACTIVE

## 2021-11-25 LAB — HEMOGLOBIN A1C
Hgb A1c MFr Bld: 5.6 % (ref 4.8–5.6)
Mean Plasma Glucose: 114.02 mg/dL

## 2021-11-25 MED ORDER — CLOPIDOGREL BISULFATE 300 MG PO TABS
300.0000 mg | ORAL_TABLET | Freq: Once | ORAL | Status: AC
  Administered 2021-11-25: 300 mg via ORAL
  Filled 2021-11-25: qty 1

## 2021-11-25 MED ORDER — ASPIRIN 81 MG PO TBEC
81.0000 mg | DELAYED_RELEASE_TABLET | Freq: Every day | ORAL | Status: DC
  Administered 2021-11-25 – 2021-11-29 (×5): 81 mg via ORAL
  Filled 2021-11-25 (×5): qty 1

## 2021-11-25 MED ORDER — NICOTINE 21 MG/24HR TD PT24
21.0000 mg | MEDICATED_PATCH | Freq: Every day | TRANSDERMAL | Status: DC
Start: 2021-11-25 — End: 2021-11-29
  Administered 2021-11-25 – 2021-11-29 (×5): 21 mg via TRANSDERMAL
  Filled 2021-11-25 (×5): qty 1

## 2021-11-25 MED ORDER — NICOTINE 14 MG/24HR TD PT24: 14.0000 mg | MEDICATED_PATCH | Freq: Every day | TRANSDERMAL | Status: DC

## 2021-11-25 MED ORDER — ROSUVASTATIN CALCIUM 20 MG PO TABS
20.0000 mg | ORAL_TABLET | Freq: Every day | ORAL | Status: DC
  Administered 2021-11-25 – 2021-11-29 (×5): 20 mg via ORAL
  Filled 2021-11-25 (×5): qty 1

## 2021-11-25 MED ORDER — STROKE: EARLY STAGES OF RECOVERY BOOK
Freq: Once | Status: AC
  Filled 2021-11-25: qty 1

## 2021-11-25 MED ORDER — ACETAMINOPHEN 325 MG PO TABS
650.0000 mg | ORAL_TABLET | ORAL | Status: DC | PRN
  Administered 2021-11-27: 650 mg via ORAL
  Filled 2021-11-25: qty 2

## 2021-11-25 MED ORDER — CLOPIDOGREL BISULFATE 75 MG PO TABS
75.0000 mg | ORAL_TABLET | Freq: Every day | ORAL | Status: DC
  Administered 2021-11-26 – 2021-11-29 (×4): 75 mg via ORAL
  Filled 2021-11-25 (×4): qty 1

## 2021-11-25 MED ORDER — ENOXAPARIN SODIUM 40 MG/0.4ML IJ SOSY
40.0000 mg | PREFILLED_SYRINGE | INTRAMUSCULAR | Status: DC
  Filled 2021-11-25 (×3): qty 0.4

## 2021-11-25 MED ORDER — IOHEXOL 350 MG/ML SOLN
75.0000 mL | Freq: Once | INTRAVENOUS | Status: AC | PRN
  Administered 2021-11-25: 75 mL via INTRAVENOUS

## 2021-11-25 MED ORDER — ACETAMINOPHEN 160 MG/5ML PO SOLN: 650.0000 mg | ORAL | Status: DC | PRN

## 2021-11-25 MED ORDER — ACETAMINOPHEN 650 MG RE SUPP: 650.0000 mg | RECTAL | Status: DC | PRN

## 2021-11-25 NOTE — Assessment & Plan Note (Signed)
FLP pending Likely start high dose statin, hopefully he agrees to take it.

## 2021-11-25 NOTE — Progress Notes (Signed)
  Echocardiogram 2D Echocardiogram has been performed.  Ronny Flurry 11/25/2021, 9:41 AM

## 2021-11-25 NOTE — Consult Note (Addendum)
Hospital Consult    Reason for Consult:  carotid stenosis Requesting Physician:  Laverta Baltimore, MD MRN #:  [id number]  History of Present Illness: This is a 66 y.o. male who presented to the hospital with left arm and face numbness that began about 3 days prior.  He did have an MRI in the hospital, which revealed a right parietal stroke.  He then underwent CTA head/neck with the following results: Right carotid system: Soft and calcified brachiocephalic artery and right CCA origin plaque without stenosis. Severe soft more so than calcified atherosclerosis at the right ICA origin and bulb, with maximal stenosis at the distal bulb numerically estimated at 65-70 % with respect to the distal vessel (series 6, image 308). Right ICA remains patent to the skull base.   Left carotid system: Mild plaque at the left CCA origin, no stenosis. Mild to moderate plaque at the left carotid bifurcation. Partially retropharyngeal course of the cervical left ICA but no significant stenosis.  He did have a 2D echo, which revealed EF of 60-65% and no thrombus was noted.   He also had MR of the cervical spine and was found to have moderate to severe spinal stenosis with severe left C5 and right C6 foraminal narrowing.   [facility] is consulted for further evaluation.   He has been started on asa/plavix.  He states he does have high blood pressure and cholesterol and would probably be better controlled if he took his medicine consistently.  He has been on as asa in the past.  He has never been on plavix.  He denies any hx of radiation or neck surgery.  Past Medical History:  Diagnosis Date   Angioma    of face, with chronic R facial paralysis   Fibromyalgia    High cholesterol    Hypertension     Past Surgical History:  Procedure Laterality Date   FACIAL COSMETIC SURGERY      Allergies  Allergen Reactions   Other Hives    NUTS    Prior to Admission medications   Medication Sig Start Date End Date Taking?  Authorizing Provider  cyclobenzaprine (FLEXERIL) 10 MG tablet Take 10 mg by mouth 3 (three) times daily as needed for muscle spasms. Patient not taking: Reported on 11/24/2021    [provider]  DULoxetine (CYMBALTA) 60 MG capsule Take 60 mg by mouth 2 (two) times daily. Patient not taking: Reported on 11/24/2021 08/18/17   [provider]  gabapentin (NEURONTIN) 600 MG tablet Take 600 mg by mouth 4 (four) times daily. Patient not taking: Reported on 11/24/2021    [provider]  gemfibrozil (LOPID) 600 MG tablet Take 600 mg by mouth 2 (two) times daily before a meal. Patient not taking: Reported on 11/24/2021    [provider]  lisinopril (PRINIVIL,ZESTRIL) 20 MG tablet Take 20 mg by mouth daily. Patient not taking: Reported on 11/24/2021 09/27/17   [provider]  meloxicam (MOBIC) 15 MG tablet Take 15 mg by mouth daily. Patient not taking: Reported on 11/24/2021 09/27/17   [provider]  tiZANidine (ZANAFLEX) 4 MG tablet Take 4 mg by mouth every 8 (eight) hours as needed for muscle spasms. Patient not taking: Reported on 11/24/2021    [provider]  traMADol (ULTRAM) 50 MG tablet Take 50-100 mg by mouth 2 (two) times daily as needed for moderate pain. Patient not taking: Reported on 11/24/2021    [provider]    Social History   Socioeconomic History  Marital status: Married    Spouse name: Not on file   Number of children: Not on file   Years of education: Not on file   Highest education level: Not on file  Occupational History   Not on file  Tobacco Use   Smoking status: Every Day    Packs/day: 0.50    Types: Cigarettes   Smokeless tobacco: Never  Vaping Use   Vaping Use: Never used  Substance and Sexual Activity   Alcohol use: Yes   Drug use: Never   Sexual activity: Not on file  Other Topics Concern   Not on file  Social History Narrative   Not on file   Social Determinants of Health    Financial Resource Strain: Not on file  Food Insecurity: Not on file  Transportation Needs: Not on file  Physical Activity: Not on file  Stress: Not on file  Social Connections: Not on file  Intimate Partner Violence: Not on file    Family History  Problem Relation Age of Onset   Stroke Mother     ROS: '[x]'$  Positive   '[ ]'$  Negative   '[ ]'$  All sytems reviewed and are negative  Cardiac: '[x]'$  high blood pressure '[x]'$  high cholesterol  Vascular: '[]'$  pain in legs while walking '[]'$  pain in legs at rest '[]'$  pain in legs at night '[]'$  non-healing ulcers '[]'$  hx of DVT '[]'$  swelling in legs  Pulmonary: '[]'$  asthma/wheezing '[]'$  home O2  Neurologic: '[x]'$  hx of CVA '[]'$  mini stroke   Hematologic: '[]'$  hx of cancer  Endocrine:   '[]'$  diabetes '[]'$  thyroid disease  GI '[]'$  GERD  GU: '[]'$  CKD/renal failure '[]'$  HD--'[]'$  M/W/F or '[]'$  T/T/S  Psychiatric: '[]'$  anxiety '[]'$  depression  Musculoskeletal: '[x]'$  spinal stenosis  Integumentary: '[]'$  rashes '[]'$  ulcers  Constitutional: '[]'$  fever  '[]'$  chills  Physical Examination  Vitals:   11/25/21 1045 11/25/21 1100  BP: 133/87 133/82  Pulse: 65 63  Resp: 17 16  Temp:    SpO2: 98% 98%   Body mass index is 22.81 kg/m.  General:  WDWN in NAD on stretcher in hallway in ER Gait: Not observed HENT: WNL, normocephalic; poor dentition Pulmonary: normal non-labored breathing Cardiac: regular, Skin: without rashes Vascular Exam/Pulses: Bilateral radial pulses are palpable Musculoskeletal: no muscle wasting or atrophy  Neurologic: A&O X 3; speech is fluent/normal Psychiatric:  The pt has Normal affect.  Full physical exam not performed due to pt in hallway  CBC    Component Value Date/Time   WBC 9.4 11/24/2021 1834   RBC 4.77 11/24/2021 1834   HGB 14.8 11/24/2021 1834   HCT 43.8 11/24/2021 1834   PLT 276 11/24/2021 1834   MCV 91.8 11/24/2021 1834   MCH 31.0 11/24/2021 1834   MCHC 33.8 11/24/2021 1834   RDW 12.7 11/24/2021 1834   LYMPHSABS 2.5  11/24/2021 1834   MONOABS 0.6 11/24/2021 1834   EOSABS 0.6 (H) 11/24/2021 1834   BASOSABS 0.1 11/24/2021 1834    BMET    Component Value Date/Time   NA 137 11/24/2021 1834   K 4.2 11/24/2021 1834   CL 105 11/24/2021 1834   CO2 23 11/24/2021 1834   GLUCOSE 126 (H) 11/24/2021 1834   BUN 14 11/24/2021 1834   CREATININE 1.03 11/24/2021 1834   CALCIUM 9.3 11/24/2021 1834   GFRNONAA >60 11/24/2021 1834   GFRAA 32 (L) 10/17/2017 1540    COAGS: Lab Results  Component Value Date   INR 1.1 11/24/2021  ASSESSMENT/PLAN: This is a 66 y.o. male with symptomatic right carotid artery stenosis  -pt presented to ER with 3 day hx of left sided weakness.  CTA of neck reveals 65-70% right ICA stenosis.   -Dr. Donzetta Matters discussed with pt about performing right TCAR later this week.   -pt has been started on plavix/asa/statin.  Discussed with pt that it will be important to be compliant with these medications after surgery.  He expressed understanding.   -will order carotid duplex for further clarification of carotid stenosis -more complete physical exam once pt is in a private room   Leontine Locket, PA-C [facility] [phone]   I have interviewed and examined patient with PA and agree with assessment and plan above. Plan Right TCAR Friday this week. He is on asa/plavix and crestor.   Josephmichael Lisenbee C. Donzetta Matters, MD [facility] [place] Office: [phone] Pager: [phone]

## 2021-11-25 NOTE — Progress Notes (Addendum)
STROKE TEAM PROGRESS NOTE   INTERVAL HISTORY No family at the bedside. Patient sitting up in bed in NAD. Patient states his left arm is still numb, but strength is improved. He endorses smoking a pack of cigarettes per day, and admits to not taking his medication as prescribed. He is alert and oriented, moves all extremities equal 5/5, decreased sensation in left arm. Ataxia in left arm   Vitals:   11/25/21 0845 11/25/21 0900 11/25/21 0915 11/25/21 0930  BP: (!) 150/97 (!) 158/98 (!) 140/98 (!) 162/104  Pulse: (!) 59 64 60 66  Resp: '17 16 13 19  '$ Temp:      TempSrc:      SpO2: 99% 99% 99% 100%  Weight:      Height:       CBC:  Recent Labs  Lab 11/24/21 1834  WBC 9.4  NEUTROABS 5.6  HGB 14.8  HCT 43.8  MCV 91.8  PLT 277   Basic Metabolic Panel:  Recent Labs  Lab 11/24/21 1834  NA 137  K 4.2  CL 105  CO2 23  GLUCOSE 126*  BUN 14  CREATININE 1.03  CALCIUM 9.3   Lipid Panel:  Recent Labs  Lab 11/25/21 0451  CHOL 192  TRIG 134  HDL 37*  CHOLHDL 5.2  VLDL 27  LDLCALC 128*   HgbA1c:  Recent Labs  Lab 11/25/21 0451  HGBA1C 5.6   Urine Drug Screen:  Recent Labs  Lab 11/24/21 1818  LABOPIA NONE DETECTED  COCAINSCRNUR NONE DETECTED  LABBENZ NONE DETECTED  AMPHETMU NONE DETECTED  THCU NONE DETECTED  LABBARB NONE DETECTED    Alcohol Level  Recent Labs  Lab 11/24/21 1834  ETH <10    IMAGING past 24 hours CT ANGIO HEAD NECK W WO CM  Result Date: 11/25/2021 CLINICAL DATA:  66 year old male with left arm weakness. Scattered small infarcts in the right MCA and MCA/PCA watershed on brain MRI today. EXAM: CT ANGIOGRAPHY HEAD AND NECK TECHNIQUE: Multidetector CT imaging of the head and neck was performed using the standard protocol during bolus administration of intravenous contrast. Multiplanar CT image reconstructions and MIPs were obtained to evaluate the vascular anatomy. Carotid stenosis measurements (when applicable) are obtained utilizing NASCET  criteria, using the distal internal carotid diameter as the denominator. RADIATION DOSE REDUCTION: This exam was performed according to the departmental dose-optimization program which includes automated exposure control, adjustment of the mA and/or kV according to patient size and/or use of iterative reconstruction technique. CONTRAST:  44m OMNIPAQUE IOHEXOL 350 MG/ML SOLN COMPARISON:  Brain MRI 0050 hours today. Head CT yesterday. FINDINGS: CTA NECK Skeleton: Carious dentition. 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 Evidence of previous ORIF at the right zygomaticomaxillary confluence. Paranasal sinus mucoperiosteal thickening. Tympanic cavities are clear. Ordinary cervical spine degeneration. No acute osseous abnormality identified. Upper chest: Multiple 4 mm bilateral upper lobe solid pulmonary nodules (series 5, image 162 on the left and image 174 on the right. Negative visible mediastinum. Other neck: Chronic appearing skin and subcutaneous thickening along the right face. Atrophied or absent right parotid gland. No definite acute neck soft tissue finding. Aortic arch: Calcified aortic atherosclerosis. 3 vessel arch configuration. Right carotid system: Soft and calcified brachiocephalic artery and right CCA origin plaque without stenosis. Severe soft more so than calcified atherosclerosis at the right ICA origin and bulb, with maximal stenosis at the distal bulb numerically estimated at 65-70 %  with respect to the distal vessel (series 6, image 308). Right ICA remains patent to the skull base. Left carotid system: Mild plaque at the left CCA origin, no stenosis. Mild to moderate plaque at the left carotid bifurcation. Partially retropharyngeal course of the cervical left ICA but no significant stenosis. Vertebral arteries: Mild proximal right subclavian artery plaque without  stenosis. Normal right vertebral artery origin. Tortuous right vertebral artery is patent to the skull base with no significant plaque or stenosis. Proximal left subclavian artery mild to moderate atherosclerosis without stenosis. Normal left vertebral artery origin. Tortuous and fairly codominant left vertebral artery is patent to the skull base without significant plaque or stenosis. CTA HEAD Posterior circulation: Mildly tortuous distal vertebral arteries are patent to the basilar without stenosis. Normal right PICA origin. Left AICA appears dominant. Patent basilar artery with plaque but no significant stenosis. Patent SCA and PCA origins. Small right posterior communicating artery, the left is diminutive or absent. Left PCA branches are within normal limits. Right PCA branches are also within normal limits. Anterior circulation: Both ICA siphons are patent with moderate calcified plaque. Only mild left siphon stenosis. Similar mild right siphon stenosis. Patent carotid termini, MCA and ACA origins. Left ACA A1 segment is dominant. Right ACA A1 segment is fenestrated. Anterior communicating artery and bilateral ACA branches are within normal limits. Left MCA M1 segment is tortuous and patent without stenosis. Left MCA branches are within normal limits. Right MCA M1 segment is patent to the bifurcation without stenosis. No right MCA branch occlusion is identified. There is mild right MCA branch irregularity. Venous sinuses: Patent. Anatomic variants: Dominant left and fenestrated right ACA A1 segments. Review of the MIP images confirms the above findings IMPRESSION: 1. Negative for large vessel occlusion. 2. Widespread atherosclerosis in the neck and at the skull base. Significant stenosis at the Left ICA bulb is numerically estimated at 65-70%. But no other significant arterial stenosis identified in the head or neck. Aortic Atherosclerosis (ICD10-I70.0). 3. Multiple 4 mm bilateral upper lobe solid pulmonary  nodules. If patient is low risk for malignancy, no routine follow-up imaging is recommended; if patient is high risk for malignancy, a non-contrast Chest CT at 12 months is optional. If performed and the nodule is stable at 12 months, no further follow-up is recommended. Reference: Radiology. 2017; 284(1):228-43. 4. 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 Prior ORIF at the right zygomaticomaxillary confluence. Carious dentition. Electronically Signed   By: Genevie Ann M.D.   On: 11/25/2021 04:19   MR Cervical Spine Wo Contrast  Result Date: 11/25/2021 CLINICAL DATA:  Initial evaluation for acute myelopathy. EXAM: MRI CERVICAL SPINE WITHOUT CONTRAST TECHNIQUE: Multiplanar, multisequence MR imaging of the cervical spine was performed. No intravenous contrast was administered. COMPARISON:  None Available. FINDINGS: Alignment: Reversal of the normal cervical lordosis. Trace facet mediated anterolisthesis of C3 on C4 and C7 on T1. Vertebrae: Vertebral body height maintained without acute or chronic fracture. Bone marrow signal intensity heterogeneous but overall within normal limits. No worrisome osseous lesions. No abnormal marrow edema. Cord: Patchy signal abnormality seen involving the bilateral hemi cord at C4-5 and C5-6, most consistent with chronic myelomalacia (series 8, images 24, 29). Findings likely due to compressive stenosis at these levels. Visualized cord otherwise within normal limits. Posterior Fossa, vertebral arteries, paraspinal tissues: Craniocervical junction normal. Paraspinous soft tissues within normal limits. Normal flow voids seen within the vertebral arteries bilaterally. Asymmetric prominence of the fatty soft tissues of the visualized right face noted.  Disc levels: C2-C3: Negative interspace. Moderate left with mild right facet hypertrophy. No spinal stenosis. Mild left C3 foraminal stenosis. Right neural foramina remains patent. C3-C4: Trace anterolisthesis. Mild  diffuse disc bulge, eccentric to the right. Associated endplate and uncovertebral spurring with right-sided facet arthrosis. Resultant mild-to-moderate spinal stenosis. Severe right with mild left C4 foraminal stenosis. C4-C5: Degenerative intervertebral disc space narrowing. Broad-based right paracentral disc osteophyte complex indents and partially faces the ventral thecal sac. Associated superior migration of soft disc material noted (series 8, image 22). Mild ligament flavum hypertrophy. Resultant flattening of the cervical cord with associated cord signal changes, compatible with chronic myelomalacia. Moderate to severe spinal stenosis with the thecal sac measuring 6-7 mm in AP diameter. Severe left with moderate right C5 foraminal stenosis. C5-C6: Degenerative intervertebral disc space narrowing. Broad base right paracentral disc osteophyte complex indents the right ventral thecal sac. Secondary flattening of the ventral cord. Associated cord signal changes compatible with compressive myelomalacia. Moderate to severe spinal stenosis. Moderate left with severe right C6 foraminal narrowing. C6-C7: Degenerative intervertebral disc space narrowing. Broad-based central to right paracentral disc osteophyte complex flattens and indents the ventral thecal sac. Mild cord flattening without cord signal changes. Mild to moderate spinal stenosis. Moderate left with mild right C7 foraminal narrowing. C7-T1: Mild disc bulge with uncovertebral spurring, greater on the left. Left greater than right facet hypertrophy. No significant spinal stenosis. Severe left C8 foraminal narrowing. Right neural foramen is patent. Visualized upper thoracic spine demonstrates no significant finding. IMPRESSION: 1. Degenerative spondylosis at C4-5 and C5-6 with resultant moderate to severe spinal stenosis, with severe left C5 and right C6 foraminal narrowing. Patchy signal abnormality involving the cervical spinal cord at these levels  consistent with chronic myelomalacia. 2. Degenerative spondylosis at C3-4 and C6-7 with resultant mild to moderate spinal stenosis, with severe right C4 and moderate left C7 foraminal stenosis. 3. Severe left C8 foraminal stenosis related to disc bulging and facet hypertrophy. Electronically Signed   By: Jeannine Boga M.D.   On: 11/25/2021 02:20   MR BRAIN WO CONTRAST  Result Date: 11/25/2021 CLINICAL DATA:  Initial evaluation for neuro deficit, stroke suspected. EXAM: MRI HEAD WITHOUT CONTRAST TECHNIQUE: Multiplanar, multiecho pulse sequences of the brain and surrounding structures were obtained without intravenous contrast. COMPARISON:  Prior CT from 11/24/2021. FINDINGS: Brain: Cerebral volume within normal limits. Right cerebellar hemisphere is dysplastic in appearance. No significant cerebral white matter disease for age. Patchy foci of restricted diffusion involving the cortical and subcortical aspect of the right parieto-occipital region, consistent with acute to early subacute ischemic infarcts. No associated mass effect or significant hemorrhage. No other evidence for acute or subacute ischemia. Gray-white matter differentiation otherwise maintained. No other areas of chronic cortical infarction. No other acute or chronic intracranial blood products. No mass lesion, mass effect or midline shift. No hydrocephalus or extra-axial fluid collection. Pituitary gland suprasellar region within normal limits. Midline structures intact. Vascular: Major intracranial vascular flow voids are maintained. Skull and upper cervical spine: Craniocervical junction within normal limits. Bone marrow signal intensity normal. Asymmetric thickening of the right frontal calvarium noted. Scalp soft tissues demonstrate no acute finding. Sinuses/Orbits: Asymmetric right-sided proptosis. Diffuse prominence of the subcutaneous fat of the visualized right face. Scattered mucosal thickening noted about the ethmoidal air cells  and maxillary sinuses. Small right mastoid effusion noted. Visualized nasopharynx unremarkable. Other: None. IMPRESSION: Patchy small volume acute to early subacute ischemic infarcts involving the cortical and subcortical aspect of the right parieto-occipital region. No associated  mass effect or hemorrhage. Electronically Signed   By: Jeannine Boga M.D.   On: 11/25/2021 02:09   CT HEAD WO CONTRAST  Result Date: 11/24/2021 CLINICAL DATA:  Left arm weakness EXAM: CT HEAD WITHOUT CONTRAST TECHNIQUE: Contiguous axial images were obtained from the base of the skull through the vertex without intravenous contrast. RADIATION DOSE REDUCTION: This exam was performed according to the departmental dose-optimization program which includes automated exposure control, adjustment of the mA and/or kV according to patient size and/or use of iterative reconstruction technique. COMPARISON:  None Available. FINDINGS: Brain: No acute territorial infarction, hemorrhage, or intracranial mass. Mild atrophy. The ventricles are nonenlarged. Vascular: No hyperdense vessels.  Carotid vascular calcification. Skull: No acute fracture. Postsurgical and posttraumatic changes of the right maxillary sinus and anterior zygomatic arch. Bony hypertrophy of the right zygomatic arch and partially visualized TMJ as well as the lateral right orbital wall, question due to prior trauma or potential fibrous dysplasia. Sinuses/Orbits: Mucosal thickening.  No acute osseous abnormality. Other: None IMPRESSION: 1. No CT evidence for acute intracranial abnormality. 2. Mild atrophy Electronically Signed   By: Donavan Foil M.D.   On: 11/24/2021 19:56    PHYSICAL EXAM He is alert and oriented, PERRL, slight upward deviation of his right eye with rightward gaze and diplopia on rightward gaze(chronic due to previous angioma), chronic right facial paralysis, moves all extremities equal 5/5, decreased sensation in left arm. Ataxia in left arm    ASSESSMENT/PLAN Jay Kelly is a 66 y.o. male with history of HTN, HLD, smoking who presents with left side arm numbness and weakness since Friday.   Stroke:  Small right MCA and watershed ischemic infarcts due to High grade Right ICA stenosis CT head 1. No CT evidence for acute intracranial abnormality. 2. Mild atrophy CTA head & neck significant stenosis at the Right ICA bulb, estimated at 65-70%. MRI Patchy small volume acute to early subacute ischemic infarcts involving the cortical and subcortical aspect of the right parieto-occipital region. 2D Echo EF 60-65% Carotid US pending LDL 128 HgbA1c 5.6 VTE prophylaxis - Lovenox No antithrombotic prior to admission, now on aspirin 81 mg daily and clopidogrel 75 mg daily. Further regimen per [facility]. Therapy recommendations: none Disposition:  pending  Right carotid stenosis CTA head & neck significant stenosis at the Right ICA bulb, estimated at 65-70%. Likely cause of stroke [facility]VS Dr. Donzetta Matters on board Plan for right TCAR on Friday Carotid Doppler pending  Hypertension Home meds:  lisinopril  Stable Avoid low BP Long-term BP goal normotensive  Hyperlipidemia Home meds:  none LDL 128, goal < 70 Add crestor '20mg'$    Continue statin at discharge  Tobacco abuse Current smoker Smoking cessation counseling provided Nicotine patch provided Pt is willing to quit  Other Stroke Risk Factors ETOH use, alcohol level <10, advised to drink no more than 1  drink(s) a day   Hospital day # 0  Beulah Gandy DNP, ACNPC-AG  ATTENDING NOTE: I reviewed above note and agree with the assessment and plan. Pt was seen and examined.   66 year old male with history of hypertension, hyperlipidemia, smoker admitted for left arm and face numbness.  MRI showed right MCA, MCA/ACA and MCA/PCA watershed small infarcts.  CTA head and neck right ICA bulb 65 to 70% stenosis.  EF 60 to 25%, LDL 128, A1c 5.6, UDS negative.  Creatinine 1.03, UA WBC 6-10.   Carotid Doppler pending.  On exam, patient awake alert, orientated x3 no aphasia, right facial scar tissue  status post surgery for angioma in the past.  Residual mild dysarthria and enlarged right palpebral fissure.  Left upper extremity mild pronator drift with dysmetria on finger-to-nose.  Left forearm decreased light touch sensation.  Otherwise right upper extremity and bilateral extremity normal strength and sensation.  Left finger-nose dysmetria.  Left hand grip 5/5.  Etiology for patient stroke likely due to right ICA high-grade stenosis.  Recommended vascular surgery consultation, Dr. Donzetta Matters on board, recommend right TCAR Friday.  Carotid Doppler pending, will follow.  Continue aspirin and Plavix DAPT, added Crestor 20, educated on smoking cessation.  PT/OT no recommendation.  We will follow.  For detailed assessment and plan, please refer to above/below as I have made changes wherever appropriate.   Rosalin Hawking, MD PhD Stroke Neurology 11/25/2021 5:54 PM    To contact Stroke Continuity provider, please refer to [email or web address] After hours, contact General Neurology

## 2021-11-25 NOTE — Progress Notes (Signed)
Patient seen and examined.  Admitted by nighttime hospitalist early morning hours.  History and investigations reviewed with patient.  Remains pretty stable.  Still in the hallway and undergoing stroke work-up. In brief, 66 year old gentleman with history of hypertension hyperlipidemia who continues to smoke, has history of right jaw reconstruction had been feeling left arm numb and weakness since Friday but only came to emergency room on Monday.  Fairly noncompliant to treatment.  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 2D echocardiogram essentially normal. Started on aspirin Plavix and statin. No indication to reduce blood pressures today. Significant right ICA stenosis causing ipsilateral stroke, will benefit with carotid endarterectomy.  Discussed with vascular surgery and inpatient procedure planned for 7/14.   Time spent: 35 minutes.  Same-day admission.  No charge visit. Patient will need stroke work-up, needing inpatient surgery to prevent from recurrent stroke.  Will need to stay in the hospital for more than 2 midnights.

## 2021-11-25 NOTE — Assessment & Plan Note (Signed)
Hold home BP meds and allow permissive HTN in setting of acute stroke. Although, patient wasn't actually taking any of his HTN meds anyhow (no compliant) he states.

## 2021-11-25 NOTE — Evaluation (Signed)
Occupational Therapy Evaluation Patient Details Name: Jay Kelly MRN: [id number] DOB: 13-Jun-1955 Today's Date: 11/25/2021   History of Present Illness 66 yo male presented to ED on 7/10 with left arm numbness. MRI showed acute to subacute rt parieto- occipital infarcts. PMH including HTN, fibromyalgia   Clinical Impression   PTA, pt was mainly living at his ex-wife's house and was independent. Pt currently performing ADLs and functional mobility at Mod I - independent level. Pt presenting with decreased coordination and sensation at LUE, vision, and cognition. Pt agreeable to OP therapy to address these deficits and their impact on functional performance. Pt would benefit from further acute OT to facilitate safe dc. Recommend dc to home once medically stable with follow up at OP for further OT to optimize safety, independence with ADLs, and return to PLOF.      Recommendations for follow up therapy are one component of a multi-disciplinary discharge planning process, led by the attending physician.  Recommendations may be updated based on patient status, additional functional criteria and insurance authorization.   Follow Up Recommendations  Outpatient OT (Neuro OP)    Assistance Recommended at Discharge PRN  Patient can return home with the following      Functional Status Assessment  Patient has had a recent decline in their functional status and demonstrates the ability to make significant improvements in function in a reasonable and predictable amount of time.  Equipment Recommendations  None recommended by OT    Recommendations for Other Services       Precautions / Restrictions Precautions Precautions: Other (comment) Precaution Comments: Reporting vision issues Restrictions Weight Bearing Restrictions: No      Mobility Bed Mobility Overal bed mobility: Independent                  Transfers Overall transfer level: Independent                         Balance Overall balance assessment: Independent                                         ADL either performed or assessed with clinical judgement   ADL Overall ADL's : Needs assistance/impaired                                       General ADL Comments: Pt performing at Mod I level with increased time for decreased functional use of LUE and vision deficits.     Vision Baseline Vision/History: 1 Wears glasses (reading) Patient Visual Report: Other (comment) (reports his vision is "off" and moving.) Vision Assessment?: Vision impaired- to be further tested in functional context     Perception     Praxis      Pertinent Vitals/Pain Pain Assessment Pain Assessment: No/denies pain     Hand Dominance Right   Extremity/Trunk Assessment Upper Extremity Assessment Upper Extremity Assessment: LUE deficits/detail LUE Deficits / Details: Poor opposition, rapid alternating movement, and FM control. Continues to report decreased sensation at forearm and hand LUE Coordination: decreased fine motor   Lower Extremity Assessment Lower Extremity Assessment: Overall WFL for tasks assessed   Cervical / Trunk Assessment Cervical / Trunk Assessment: Normal   Communication Communication Communication: No difficulties   Cognition Arousal/Alertness: Awake/alert Behavior During Therapy:  WFL for tasks assessed/performed (Slightly impulsive but feel this is baseline) Overall Cognitive Status: Impaired/Different from baseline Area of Impairment: Following commands, Problem solving, Awareness                       Following Commands: Follows one step commands with increased time   Awareness: Emergent, Anticipatory Problem Solving: Slow processing, Requires verbal cues General Comments: Pt reporting he feels a little off with processing, problem solving, and memory. Naming 2/3 animals that start with "C" - and during this task     General  Comments  VSS    Exercises     Shoulder Instructions      Home Living Family/patient expects to be discharged to:: Private residence Living Arrangements: Other (Comment) (ex-wife) Available Help at Discharge: Available PRN/intermittently Type of Home: House Home Access: Stairs to enter [employer] of Steps: "a few"   Home Layout: Two level;Able to live on main level with bedroom/bathroom     Bathroom Shower/Tub: Tub/shower unit;Walk-in shower   Bathroom Toilet: Standard     Home Equipment: None   Additional Comments: Providing inconsistent information with information about his home (wit hhis brother) and then his ex-wife's home. Information above for ex-wifes home who he plans to dc with      Prior Functioning/Environment Prior Level of Function : Independent/Modified Independent               ADLs Comments: ADLs, IADLs, and driving. Not working        OT Problem List: Decreased safety awareness;Decreased cognition;Impaired UE functional use;Impaired vision/perception      OT Treatment/Interventions: Self-care/ADL training;Therapeutic exercise;Energy conservation;DME and/or AE instruction;Therapeutic activities;Patient/family education    OT Goals(Current goals can be found in the care plan section) Acute Rehab OT Goals Patient Stated Goal: Go home OT Goal Formulation: With patient Time For Goal Achievement: 12/09/21 Potential to Achieve Goals: Good  OT Frequency: Min 3X/week    Co-evaluation              AM-PAC OT "6 Clicks" Daily Activity     Outcome Measure Help from another person eating meals?: None Help from another person taking care of personal grooming?: None Help from another person toileting, which includes using toliet, bedpan, or urinal?: None Help from another person bathing (including washing, rinsing, drying)?: None Help from another person to put on and taking off regular upper body clothing?: None Help from another  person to put on and taking off regular lower body clothing?: None 6 Click Score: 24   End of Session Nurse Communication: Mobility status  Activity Tolerance: Patient tolerated treatment well Patient left: in bed;with call bell/phone within reach  OT Visit Diagnosis: Hemiplegia and hemiparesis;Other symptoms and signs involving cognitive function;Low vision, both eyes (H54.2) Hemiplegia - Right/Left: Left Hemiplegia - dominant/non-dominant: Non-Dominant Hemiplegia - caused by: Cerebral infarction                Time: 5366-4403 OT Time Calculation (min): 9 min Charges:  OT General Charges $OT Visit: 1 Visit OT Evaluation $OT Eval Low Complexity: 1 Low  Wanisha Shiroma MSOT, OTR/L Acute Rehab Office: [place] 11/25/2021, 1:38 PM

## 2021-11-25 NOTE — Consult Note (Addendum)
Neurology Consultation Reason for Consult: Stroke Referring Physician: Long, J  CC: with left arm numbness  History is obtained from:Patient  HPI: Jay Kelly is a 66 y.o. male with a history of htn who presents with left arm numbness that started Friday.  He states that initially it was his whole left side, but it has improved and just his left arm is still persistent.  He states "I guess I should have come on Friday" to which I affirm his assessment.  He does state that it is getting better, when asked why he came in today, he just said "I guess I just decided I would come in and get checked out."  In the emergency department an MRI was performed which demonstrates a right parietal stroke.   LKW: Friday tpa given?: no, out of window   Past Medical History:  Diagnosis Date   Fibromyalgia    High cholesterol    Hypertension      History reviewed. No pertinent family history.   Social History:  reports that he has been smoking cigarettes. He has been smoking an average of .5 packs per day. He has never used smokeless tobacco. He reports current alcohol use. He reports that he does not use drugs.   Exam: Current vital signs: BP 134/78   Pulse 66   Temp 98.4 F (36.9 C) (Oral)   Resp 18   Ht '5\' 8"'$  (1.727 m)   Wt 68 kg   SpO2 99%   BMI 22.81 kg/m  Vital signs in last 24 hours: Temp:  [97.9 F (36.6 C)-98.6 F (37 C)] 98.4 F (36.9 C) (07/11 0145) Pulse Rate:  [66-73] 66 (07/11 0145) Resp:  [14-18] 18 (07/11 0145) BP: (134-158)/(78-108) 134/78 (07/11 0145) SpO2:  [96 %-99 %] 99 % (07/11 0145) Weight:  [68 kg] 68 kg (07/10 1739)   Physical Exam  Constitutional: Appears well-developed and well-nourished.  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  Cardiovascular: Normal rate and regular rhythm.  Respiratory: Effort normal, non-labored breathing GI:  Soft.  No distension. There is no tenderness.  Skin: WDI  Neuro: Mental Status: Patient is awake, alert, oriented to person, place, month, year, and situation. Patient is able to give a clear and coherent history. No signs of aphasia or neglect Cranial Nerves: II: Visual Fields are full. Pupils are equal, round, and reactive to light.   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 VIII: hearing is intact to voice X: Uvula elevates symmetrically XI: Shoulder shrug is symmetric. XII: tongue is midline without atrophy or fasciculations.  Motor: Tone is normal. Bulk is normal. 5/5 strength was present in all four extremities.  Sensory: Sensation is diminished in the right arm Cerebellar: No clear ataxia     I have reviewed labs in epic and the results pertinent to this consultation are: Cr 1.03 CBC - normal   I have reviewed the images obtained: MRI brain - Embolic appearing R posterior MCA territory patchy stroke.   Impression: 66 yo M with likely embolic stroke.  He will need to be admitted for secondary risk factor modification and cardioembolic work-up.    Recommendations: - HgbA1c, fasting lipid panel - Frequent  neuro checks - Echocardiogram - CTA head and neck - Prophylactic therapy-Antiplatelet med: Aspirin - dose '81mg'$  and plavix '75mg'$  daily after '300mg'$  load  - Risk factor modification - Telemetry monitoring - PT consult, OT consult, Speech consult - Stroke team to follow   Roland Rack, MD [facility] [phone]  If 7pm- 7am, please page neurology on call as listed in [place].

## 2021-11-25 NOTE — Assessment & Plan Note (Signed)
Nicotine patch ordered. Will try to convince him to consider quitting, but from the sounds of it, I may not have much luck.

## 2021-11-25 NOTE — H&P (Signed)
History and Physical    Patient: Jay Kelly XAJ:[id number] DOB: 1956/03/21 DOA: 11/24/2021 DOS: the patient was seen and examined on 11/25/2021 PCP: Trey Sailors, PA  Patient coming from: Home  Chief Complaint:  Chief Complaint  Patient presents with   Numbness   HPI: Jay Kelly is a 66 y.o. male with medical history significant of HTN, HLD, smoking.  Pt admits he doesn't take any of his prescribed meds for HTN/HLD (or any meds for that matter).  Pt presents with L arm numbness that onset on Friday.  Symptoms are persistent.  Pt states "I think I had a stroke on Friday".  In ED: Unfortunately, patient's statement, does appear to be correct.    Review of Systems: As mentioned in the history of present illness. All other systems reviewed and are negative. Past Medical History:  Diagnosis Date   Fibromyalgia    High cholesterol    Hypertension    Past Surgical History:  Procedure Laterality Date   FACIAL COSMETIC SURGERY     Social History:  reports that he has been smoking cigarettes. He has been smoking an average of .5 packs per day. He has never used smokeless tobacco. He reports current alcohol use. He reports that he does not use drugs.  Allergies  Allergen Reactions   Other Hives    NUTS    History reviewed. No pertinent family history.  Prior to Admission medications   Medication Sig Start Date End Date Taking? Authorizing Provider  cyclobenzaprine (FLEXERIL) 10 MG tablet Take 10 mg by mouth 3 (three) times daily as needed for muscle spasms. Patient not taking: Reported on 11/24/2021    [provider]  DULoxetine (CYMBALTA) 60 MG capsule Take 60 mg by mouth 2 (two) times daily. Patient not taking: Reported on 11/24/2021 08/18/17   [provider]  gabapentin (NEURONTIN) 600 MG tablet Take 600 mg by mouth 4 (four) times daily. Patient not taking: Reported on 11/24/2021    [provider]  gemfibrozil (LOPID) 600 MG tablet Take  600 mg by mouth 2 (two) times daily before a meal. Patient not taking: Reported on 11/24/2021    [provider]  lisinopril (PRINIVIL,ZESTRIL) 20 MG tablet Take 20 mg by mouth daily. Patient not taking: Reported on 11/24/2021 09/27/17   [provider]  meloxicam (MOBIC) 15 MG tablet Take 15 mg by mouth daily. Patient not taking: Reported on 11/24/2021 09/27/17   [provider]  tiZANidine (ZANAFLEX) 4 MG tablet Take 4 mg by mouth every 8 (eight) hours as needed for muscle spasms. Patient not taking: Reported on 11/24/2021    [provider]  traMADol (ULTRAM) 50 MG tablet Take 50-100 mg by mouth 2 (two) times daily as needed for moderate pain. Patient not taking: Reported on 11/24/2021    [provider]    Physical Exam: Vitals:   11/24/21 2015 11/24/21 2244 11/25/21 0145 11/25/21 0327  BP: 137/78 (!) 136/91 134/78 (!) 153/94  Pulse: 71 68 66 62  Resp: '16 16 18 18  '$ Temp:   98.4 F (36.9 C)   TempSrc:   Oral   SpO2: 96% 97% 99% 100%  Weight:      Height:       Constitutional: NAD, calm, comfortable Eyes: PERRL, lids and conjunctivae normal ENMT: Mucous membranes are moist. Posterior pharynx clear of any exudate or lesions.Normal dentition.  Neck: normal, supple, no masses, no thyromegaly Respiratory: clear to auscultation bilaterally, no wheezing, no crackles. Normal  respiratory effort. No accessory muscle use.  Cardiovascular: Regular rate and rhythm, no murmurs / rubs / gallops. No extremity edema. 2+ pedal pulses. No carotid bruits.  Abdomen: no tenderness, no masses palpated. No hepatosplenomegaly. Bowel sounds positive.  Musculoskeletal: no clubbing / cyanosis. No joint deformity upper and lower extremities. Good ROM, no contractures. Normal muscle tone.  Skin: no rashes, lesions, ulcers. No induration Neurologic: L arm numbness Psychiatric: Normal judgment and insight. Alert and oriented x 3. Normal mood.   Data Reviewed:     CT  head neg.  MRI brain: IMPRESSION: Patchy small volume acute to early subacute ischemic infarcts involving the cortical and subcortical aspect of the right parieto-occipital region. No associated mass effect or hemorrhage.  Assessment and Plan: * Acute ischemic stroke [facility]) Stroke pathway CTA head and neck pending 2d echo Tele monitor FLP, A1C PT/OT/SLP ASA 81 Plavix 75 for 21 days  HTN (hypertension) Hold home BP meds and allow permissive HTN in setting of acute stroke. Although, patient wasn't actually taking any of his HTN meds anyhow (no compliant) he states.  HLD (hyperlipidemia) FLP pending Likely start high dose statin, hopefully he agrees to take it.  Smoking Nicotine patch ordered. Will try to convince him to consider quitting, but from the sounds of it, I may not have much luck.  Noncompliance with medication regimen Pt admits he only takes home meds when he "feels like it" Does admit he probably should take meds more.      Advance Care Planning:   Code Status: Full Code  Consults: Neuro  Family Communication: No family in room  Severity of Illness: The appropriate patient status for this patient is OBSERVATION. Observation status is judged to be reasonable and necessary in order to provide the required intensity of service to ensure the patient's safety. The patient's presenting symptoms, physical exam findings, and initial radiographic and laboratory data in the context of their medical condition is felt to place them at decreased risk for further clinical deterioration. Furthermore, it is anticipated that the patient will be medically stable for discharge from the hospital within 2 midnights of admission.   Author: Etta Quill., DO 11/25/2021 3:51 AM  For on call review [email or web address].

## 2021-11-25 NOTE — Assessment & Plan Note (Signed)
1. Stroke pathway 2. CTA head and neck pending 3. 2d echo 4. Tele monitor 5. FLP, A1C 6. PT/OT/SLP 7. ASA 81 8. Plavix 75 for 21 days

## 2021-11-25 NOTE — Progress Notes (Signed)
PT Cancellation Note  Patient Details Name: Jay Kelly MRN: [id number] DOB: [birth year]   Cancelled Treatment:    Reason Eval/Treat Not Completed: PT screened, no needs identified, will sign off. Pt independent with mobility.   [place] 11/25/2021, 1:36 PM [place] Office [phone]

## 2021-11-25 NOTE — ED Provider Notes (Signed)
Emergency Department Provider Note   I have reviewed the triage vital signs and the nursing notes.   HISTORY  Chief Complaint Numbness   HPI Jay Kelly is a 66 y.o. male with past history of hypertension, high cholesterol, smoking presents to the emergency department with left arm and face numbness.  Symptoms began 3 days prior.  He had some associated headache which is since improved.  No particular weakness, gait instability, numbness/weakness in the leg.  No prior history of stroke.  He does not take his home medications regularly for hypertension or cholesterol.  He continues to smoke cigarettes.  No sudden onset/maximal intensity headache symptoms.   Past Medical History:  Diagnosis Date   Angioma    of face, with chronic R facial paralysis   Fibromyalgia    High cholesterol    Hypertension     Review of Systems  Constitutional: No fever/chills Eyes: No visual changes. ENT: No sore throat. Cardiovascular: Denies chest pain. Respiratory: Denies shortness of breath. Gastrointestinal: No abdominal pain.  No nausea, no vomiting.  No diarrhea.  No constipation. Genitourinary: Negative for dysuria. Musculoskeletal: Negative for back pain. Skin: Negative for rash. Neurological: Positive HA with left arm/face numbness.   ____________________________________________   PHYSICAL EXAM:  VITAL SIGNS: ED Triage Vitals  Enc Vitals Group     BP 11/24/21 1738 (!) 158/108     Pulse Rate 11/24/21 1738 73     Resp 11/24/21 1738 18     Temp 11/24/21 1738 98.6 F (37 C)     Temp Source 11/24/21 1738 Oral     SpO2 11/24/21 1738 99 %     Weight 11/24/21 1739 150 lb (68 kg)     Height 11/24/21 1739 '5\' 8"'$  (1.727 m)   Constitutional: Alert and oriented. Well appearing and in no acute distress. Eyes: Conjunctivae are normal.  Head: Atraumatic. Nose: No congestion/rhinnorhea. Mouth/Throat: Mucous membranes are moist.   Neck: No stridor.   Cardiovascular: Normal rate,  regular rhythm. Good peripheral circulation. Grossly normal heart sounds.   Respiratory: Normal respiratory effort.  No retractions. Lungs CTAB. Gastrointestinal: Soft and nontender. No distention.  Musculoskeletal: No lower extremity tenderness nor edema. No gross deformities of extremities. Neurologic:  Normal speech and language.  5/5 strength in the bilateral upper and lower extremities.  No pronator drift.  Decree sensation to the left arm compared to the right to light touch.  Normal sensation in the legs bilaterally.  Normal face sensation. Skin:  Skin is warm, dry and intact. No rash noted.  ____________________________________________   LABS (all labs ordered are listed, but only abnormal results are displayed)  Labs Reviewed  DIFFERENTIAL - Abnormal; Notable for the following components:      Result Value   Eosinophils Absolute 0.6 (*)    All other components within normal limits  COMPREHENSIVE METABOLIC PANEL - Abnormal; Notable for the following components:   Glucose, Bld 126 (*)    All other components within normal limits  URINALYSIS, ROUTINE W REFLEX MICROSCOPIC - Abnormal; Notable for the following components:   APPearance HAZY (*)    Leukocytes,Ua TRACE (*)    Bacteria, UA RARE (*)    All other components within normal limits  LIPID PANEL - Abnormal; Notable for the following components:   HDL 37 (*)    LDL Cholesterol 128 (*)    All other components within normal limits  ETHANOL  PROTIME-INR  APTT  CBC  RAPID URINE DRUG SCREEN, HOSP PERFORMED  HEMOGLOBIN A1C  HIV ANTIBODY (ROUTINE TESTING W REFLEX)   ____________________________________________  EKG   EKG Interpretation  Date/Time:  Monday November 24 2021 18:24:22 EDT Ventricular Rate:  66 PR Interval:  140 QRS Duration: 82 QT Interval:  406 QTC Calculation: 425 R Axis:   80 Text Interpretation: Normal sinus rhythm Normal ECG When compared with ECG of 17-Oct-2017 14:26, PREVIOUS ECG IS PRESENT  Confirmed by Nanda Quinton [phone]) on 11/25/2021 2:53:15 AM        ____________________________________________  RADIOLOGY  CT ANGIO HEAD NECK W WO CM  Result Date: 11/25/2021 CLINICAL DATA:  66 year old male with left arm weakness. Scattered small infarcts in the right MCA and MCA/PCA watershed on brain MRI today. EXAM: CT ANGIOGRAPHY HEAD AND NECK TECHNIQUE: Multidetector CT imaging of the head and neck was performed using the standard protocol during bolus administration of intravenous contrast. Multiplanar CT image reconstructions and MIPs were obtained to evaluate the vascular anatomy. Carotid stenosis measurements (when applicable) are obtained utilizing NASCET criteria, using the distal internal carotid diameter as the denominator. RADIATION DOSE REDUCTION: This exam was performed according to the departmental dose-optimization program which includes automated exposure control, adjustment of the mA and/or kV according to patient size and/or use of iterative reconstruction technique. CONTRAST:  73m OMNIPAQUE IOHEXOL 350 MG/ML SOLN COMPARISON:  Brain MRI 0050 hours today. Head CT yesterday. FINDINGS: CTA NECK Skeleton: Carious dentition. 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 Evidence of previous ORIF at the right zygomaticomaxillary confluence. Paranasal sinus mucoperiosteal thickening. Tympanic cavities are clear. Ordinary cervical spine degeneration. No acute osseous abnormality identified. Upper chest: Multiple 4 mm bilateral upper lobe solid pulmonary nodules (series 5, image 162 on the left and image 174 on the right. Negative visible mediastinum. Other neck: Chronic appearing skin and subcutaneous thickening along the right face. Atrophied or absent right parotid gland. No definite acute neck soft tissue finding. Aortic arch: Calcified aortic atherosclerosis. 3  vessel arch configuration. Right carotid system: Soft and calcified brachiocephalic artery and right CCA origin plaque without stenosis. Severe soft more so than calcified atherosclerosis at the right ICA origin and bulb, with maximal stenosis at the distal bulb numerically estimated at 65-70 % with respect to the distal vessel (series 6, image 308). Right ICA remains patent to the skull base. Left carotid system: Mild plaque at the left CCA origin, no stenosis. Mild to moderate plaque at the left carotid bifurcation. Partially retropharyngeal course of the cervical left ICA but no significant stenosis. Vertebral arteries: Mild proximal right subclavian artery plaque without stenosis. Normal right vertebral artery origin. Tortuous right vertebral artery is patent to the skull base with no significant plaque or stenosis. Proximal left subclavian artery mild to moderate atherosclerosis without stenosis. Normal left vertebral artery origin. Tortuous and fairly codominant left vertebral artery is patent to the skull base without significant plaque or stenosis. CTA HEAD Posterior circulation: Mildly tortuous distal vertebral arteries are patent to the basilar without stenosis. Normal right PICA origin. Left AICA appears dominant. Patent basilar artery with plaque but no significant stenosis. Patent SCA and PCA origins. Small right posterior communicating artery, the left is diminutive or absent. Left PCA branches are within normal limits. Right PCA branches are also within normal limits. Anterior circulation: Both ICA siphons are patent with moderate calcified plaque. Only mild left siphon stenosis. Similar mild right siphon stenosis. Patent carotid termini, MCA and ACA origins. Left ACA A1  segment is dominant. Right ACA A1 segment is fenestrated. Anterior communicating artery and bilateral ACA branches are within normal limits. Left MCA M1 segment is tortuous and patent without stenosis. Left MCA branches are within  normal limits. Right MCA M1 segment is patent to the bifurcation without stenosis. No right MCA branch occlusion is identified. There is mild right MCA branch irregularity. Venous sinuses: Patent. Anatomic variants: Dominant left and fenestrated right ACA A1 segments. Review of the MIP images confirms the above findings IMPRESSION: 1. Negative for large vessel occlusion. 2. Widespread atherosclerosis in the neck and at the skull base. Significant stenosis at the Left ICA bulb is numerically estimated at 65-70%. But no other significant arterial stenosis identified in the head or neck. Aortic Atherosclerosis (ICD10-I70.0). 3. Multiple 4 mm bilateral upper lobe solid pulmonary nodules. If patient is low risk for malignancy, no routine follow-up imaging is recommended; if patient is high risk for malignancy, a non-contrast Chest CT at 12 months is optional. If performed and the nodule is stable at 12 months, no further follow-up is recommended. Reference: Radiology. 2017; 284(1):228-43. 4. 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 Prior ORIF at the right zygomaticomaxillary confluence. Carious dentition. Electronically Signed   By: Genevie Ann M.D.   On: 11/25/2021 04:19   MR Cervical Spine Wo Contrast  Result Date: 11/25/2021 CLINICAL DATA:  Initial evaluation for acute myelopathy. EXAM: MRI CERVICAL SPINE WITHOUT CONTRAST TECHNIQUE: Multiplanar, multisequence MR imaging of the cervical spine was performed. No intravenous contrast was administered. COMPARISON:  None Available. FINDINGS: Alignment: Reversal of the normal cervical lordosis. Trace facet mediated anterolisthesis of C3 on C4 and C7 on T1. Vertebrae: Vertebral body height maintained without acute or chronic fracture. Bone marrow signal intensity heterogeneous but overall within normal limits. No worrisome osseous lesions. No abnormal marrow edema. Cord: Patchy signal abnormality seen involving the bilateral hemi cord at C4-5 and C5-6,  most consistent with chronic myelomalacia (series 8, images 24, 29). Findings likely due to compressive stenosis at these levels. Visualized cord otherwise within normal limits. Posterior Fossa, vertebral arteries, paraspinal tissues: Craniocervical junction normal. Paraspinous soft tissues within normal limits. Normal flow voids seen within the vertebral arteries bilaterally. Asymmetric prominence of the fatty soft tissues of the visualized right face noted. Disc levels: C2-C3: Negative interspace. Moderate left with mild right facet hypertrophy. No spinal stenosis. Mild left C3 foraminal stenosis. Right neural foramina remains patent. C3-C4: Trace anterolisthesis. Mild diffuse disc bulge, eccentric to the right. Associated endplate and uncovertebral spurring with right-sided facet arthrosis. Resultant mild-to-moderate spinal stenosis. Severe right with mild left C4 foraminal stenosis. C4-C5: Degenerative intervertebral disc space narrowing. Broad-based right paracentral disc osteophyte complex indents and partially faces the ventral thecal sac. Associated superior migration of soft disc material noted (series 8, image 22). Mild ligament flavum hypertrophy. Resultant flattening of the cervical cord with associated cord signal changes, compatible with chronic myelomalacia. Moderate to severe spinal stenosis with the thecal sac measuring 6-7 mm in AP diameter. Severe left with moderate right C5 foraminal stenosis. C5-C6: Degenerative intervertebral disc space narrowing. Broad base right paracentral disc osteophyte complex indents the right ventral thecal sac. Secondary flattening of the ventral cord. Associated cord signal changes compatible with compressive myelomalacia. Moderate to severe spinal stenosis. Moderate left with severe right C6 foraminal narrowing. C6-C7: Degenerative intervertebral disc space narrowing. Broad-based central to right paracentral disc osteophyte complex flattens and indents the ventral  thecal sac. Mild cord flattening without cord signal changes. Mild to moderate spinal  stenosis. Moderate left with mild right C7 foraminal narrowing. C7-T1: Mild disc bulge with uncovertebral spurring, greater on the left. Left greater than right facet hypertrophy. No significant spinal stenosis. Severe left C8 foraminal narrowing. Right neural foramen is patent. Visualized upper thoracic spine demonstrates no significant finding. IMPRESSION: 1. Degenerative spondylosis at C4-5 and C5-6 with resultant moderate to severe spinal stenosis, with severe left C5 and right C6 foraminal narrowing. Patchy signal abnormality involving the cervical spinal cord at these levels consistent with chronic myelomalacia. 2. Degenerative spondylosis at C3-4 and C6-7 with resultant mild to moderate spinal stenosis, with severe right C4 and moderate left C7 foraminal stenosis. 3. Severe left C8 foraminal stenosis related to disc bulging and facet hypertrophy. Electronically Signed   By: Jeannine Boga M.D.   On: 11/25/2021 02:20   MR BRAIN WO CONTRAST  Result Date: 11/25/2021 CLINICAL DATA:  Initial evaluation for neuro deficit, stroke suspected. EXAM: MRI HEAD WITHOUT CONTRAST TECHNIQUE: Multiplanar, multiecho pulse sequences of the brain and surrounding structures were obtained without intravenous contrast. COMPARISON:  Prior CT from 11/24/2021. FINDINGS: Brain: Cerebral volume within normal limits. Right cerebellar hemisphere is dysplastic in appearance. No significant cerebral white matter disease for age. Patchy foci of restricted diffusion involving the cortical and subcortical aspect of the right parieto-occipital region, consistent with acute to early subacute ischemic infarcts. No associated mass effect or significant hemorrhage. No other evidence for acute or subacute ischemia. Gray-white matter differentiation otherwise maintained. No other areas of chronic cortical infarction. No other acute or chronic intracranial  blood products. No mass lesion, mass effect or midline shift. No hydrocephalus or extra-axial fluid collection. Pituitary gland suprasellar region within normal limits. Midline structures intact. Vascular: Major intracranial vascular flow voids are maintained. Skull and upper cervical spine: Craniocervical junction within normal limits. Bone marrow signal intensity normal. Asymmetric thickening of the right frontal calvarium noted. Scalp soft tissues demonstrate no acute finding. Sinuses/Orbits: Asymmetric right-sided proptosis. Diffuse prominence of the subcutaneous fat of the visualized right face. Scattered mucosal thickening noted about the ethmoidal air cells and maxillary sinuses. Small right mastoid effusion noted. Visualized nasopharynx unremarkable. Other: None. IMPRESSION: Patchy small volume acute to early subacute ischemic infarcts involving the cortical and subcortical aspect of the right parieto-occipital region. No associated mass effect or hemorrhage. Electronically Signed   By: Jeannine Boga M.D.   On: 11/25/2021 02:09   CT HEAD WO CONTRAST  Result Date: 11/24/2021 CLINICAL DATA:  Left arm weakness EXAM: CT HEAD WITHOUT CONTRAST TECHNIQUE: Contiguous axial images were obtained from the base of the skull through the vertex without intravenous contrast. RADIATION DOSE REDUCTION: This exam was performed according to the departmental dose-optimization program which includes automated exposure control, adjustment of the mA and/or kV according to patient size and/or use of iterative reconstruction technique. COMPARISON:  None Available. FINDINGS: Brain: No acute territorial infarction, hemorrhage, or intracranial mass. Mild atrophy. The ventricles are nonenlarged. Vascular: No hyperdense vessels.  Carotid vascular calcification. Skull: No acute fracture. Postsurgical and posttraumatic changes of the right maxillary sinus and anterior zygomatic arch. Bony hypertrophy of the right zygomatic arch  and partially visualized TMJ as well as the lateral right orbital wall, question due to prior trauma or potential fibrous dysplasia. Sinuses/Orbits: Mucosal thickening.  No acute osseous abnormality. Other: None IMPRESSION: 1. No CT evidence for acute intracranial abnormality. 2. Mild atrophy Electronically Signed   By: Donavan Foil M.D.   On: 11/24/2021 19:56    ____________________________________________   PROCEDURES  Procedure(s) performed:  Procedures  None  ____________________________________________   INITIAL IMPRESSION / ASSESSMENT AND PLAN / ED COURSE  Pertinent labs & imaging results that were available during my care of the patient were reviewed by me and considered in my medical decision making (see chart for details).   This patient is Presenting for Evaluation of numbness, which does require a range of treatment options, and is a complaint that involves a high risk of morbidity and mortality.  The Differential Diagnoses includes CVA, neuropathy, cervical radiculopathy, complex migraine, SAH.   I did obtain Additional Historical Information from family at bedside.  I decided to review pertinent External Data, and in summary referred from UC earlier today for ED evaluation.   Clinical Laboratory Tests Ordered, included mild hyperglycemia without DKA.  No anemia or leukocytosis.  INR normal.  Alcohol normal.  Radiologic Tests Ordered, included CT head, MRI brain/c spine. I independently interpreted the images and agree with radiology interpretation.   Cardiac Monitor Tracing which shows NSR.   Social Determinants of Health Risk patient is an active smoker.   Consult complete with Neurology, Dr. Leonel Ramsay. Plan for consultation and CVA admit given MRI findings.   Medicine: Dr. Alcario Drought to admit.  Medical Decision Making: Summary: Patient presents emergency department with left arm numbness/left face numbness.  CT without acute finding.  MRI shows acute to early  subacute infarct.  Discussed with neurology and patient will need to come in for stroke evaluation.  Reevaluation with update and discussion with patient and family. Plan for admit. They are in agreement.   Disposition: admit  ____________________________________________  FINAL CLINICAL IMPRESSION(S) / ED DIAGNOSES  Final diagnoses:  Cerebrovascular accident (CVA), unspecified mechanism ([place])    Note:  This document was prepared using Dragon voice recognition software and may include unintentional dictation errors.  Nanda Quinton, MD, [facility]    Stpehen Petitjean, [facility], MD 11/25/21 [phone]

## 2021-11-25 NOTE — Assessment & Plan Note (Addendum)
Pt admits he only takes home meds when he "feels like it" Does admit he probably should take meds more.

## 2021-11-25 NOTE — ED Notes (Signed)
Patient transported to MRI 

## 2021-11-26 ENCOUNTER — Observation Stay (HOSPITAL_COMMUNITY): Payer: Medicare Other

## 2021-11-26 DIAGNOSIS — E785 Hyperlipidemia, unspecified: Secondary | ICD-10-CM | POA: Diagnosis not present

## 2021-11-26 DIAGNOSIS — I63131 Cerebral infarction due to embolism of right carotid artery: Secondary | ICD-10-CM | POA: Diagnosis not present

## 2021-11-26 DIAGNOSIS — I639 Cerebral infarction, unspecified: Secondary | ICD-10-CM | POA: Diagnosis present

## 2021-11-26 DIAGNOSIS — I63511 Cerebral infarction due to unspecified occlusion or stenosis of right middle cerebral artery: Secondary | ICD-10-CM | POA: Diagnosis present

## 2021-11-26 DIAGNOSIS — E7800 Pure hypercholesterolemia, unspecified: Secondary | ICD-10-CM | POA: Diagnosis present

## 2021-11-26 DIAGNOSIS — I63231 Cerebral infarction due to unspecified occlusion or stenosis of right carotid arteries: Secondary | ICD-10-CM | POA: Diagnosis present

## 2021-11-26 DIAGNOSIS — I63239 Cerebral infarction due to unspecified occlusion or stenosis of unspecified carotid arteries: Secondary | ICD-10-CM

## 2021-11-26 DIAGNOSIS — G451 Carotid artery syndrome (hemispheric): Secondary | ICD-10-CM

## 2021-11-26 DIAGNOSIS — F17210 Nicotine dependence, cigarettes, uncomplicated: Secondary | ICD-10-CM | POA: Diagnosis present

## 2021-11-26 DIAGNOSIS — R29702 NIHSS score 2: Secondary | ICD-10-CM | POA: Diagnosis present

## 2021-11-26 DIAGNOSIS — G8194 Hemiplegia, unspecified affecting left nondominant side: Secondary | ICD-10-CM | POA: Diagnosis present

## 2021-11-26 DIAGNOSIS — F17200 Nicotine dependence, unspecified, uncomplicated: Secondary | ICD-10-CM | POA: Diagnosis not present

## 2021-11-26 DIAGNOSIS — R29810 Facial weakness: Secondary | ICD-10-CM | POA: Diagnosis present

## 2021-11-26 DIAGNOSIS — I6521 Occlusion and stenosis of right carotid artery: Secondary | ICD-10-CM | POA: Diagnosis not present

## 2021-11-26 DIAGNOSIS — M797 Fibromyalgia: Secondary | ICD-10-CM | POA: Diagnosis present

## 2021-11-26 DIAGNOSIS — Z79899 Other long term (current) drug therapy: Secondary | ICD-10-CM | POA: Diagnosis not present

## 2021-11-26 DIAGNOSIS — I10 Essential (primary) hypertension: Secondary | ICD-10-CM | POA: Diagnosis present

## 2021-11-26 DIAGNOSIS — Z91148 Patient's other noncompliance with medication regimen for other reason: Secondary | ICD-10-CM | POA: Diagnosis not present

## 2021-11-26 DIAGNOSIS — R278 Other lack of coordination: Secondary | ICD-10-CM | POA: Diagnosis present

## 2021-11-26 DIAGNOSIS — R471 Dysarthria and anarthria: Secondary | ICD-10-CM | POA: Diagnosis present

## 2021-11-26 DIAGNOSIS — I739 Peripheral vascular disease, unspecified: Secondary | ICD-10-CM | POA: Diagnosis not present

## 2021-11-26 NOTE — Evaluation (Signed)
Speech Language Pathology Evaluation Patient Details Name: Jay Kelly MRN: [id number] DOB: May 22, 1955 Today's Date: 11/26/2021 Time: 6314-9702 SLP Time Calculation (min) (ACUTE ONLY): 32 min  Problem List:  Patient Active Problem List   Diagnosis Date Noted   Acute ischemic stroke ([place]) 11/25/2021   HLD (hyperlipidemia) 11/25/2021   HTN (hypertension) 11/25/2021   Smoking 11/25/2021   Noncompliance with medication regimen 11/25/2021   Past Medical History:  Past Medical History:  Diagnosis Date   Angioma    of face, with chronic R facial paralysis   Fibromyalgia    High cholesterol    Hypertension    Past Surgical History:  Past Surgical History:  Procedure Laterality Date   FACIAL COSMETIC SURGERY     HPI:  Jay Kelly is a 66 y.o. male with medical history significant of HTN, HLD, smoking.  Pt not consistent with medication administration per self report.  On 11/25/21, pt presented with L arm numbness with onset on Friday (11/21/21). Pt states "I think I had a stroke on Friday." MRI brain on 11/25/21 indicated Patchy small volume acute to early subacute ischemic infarcts involving the cortical and subcortical aspect of the right parieto-occipital region. No associated mass effect or hemorrhage.  SLE generated d/t potential higher level functioning issues per OT report.   Assessment / Plan / Recommendation Clinical Impression  Pt administered the [place] Mental Status Examination (SLUMS) with a score of 23/30 obtained (with 27/30 typical score in normal range) with deficits noted in the areas of memory, attention and problem solving overall.  Pt required repetition with many tasks and would make comments such as "my thinking has been foggy" and "my vision is slower."  Pt with impaired recall of objects, difficulty with digit reversal, and attending to simple calculation problem and auditory comprehension of simple paragraph.  Pt noted memory deficits at baseline,  but gave vague information re: this issue.  Pt with intelligible speech with multiple facial surgeries at baseline impacting overall speech acuity (min dysarthric), but this could be also affected by new L facial sensory impairment.  Denies dysphagia, passed [facility] swallow screen.  OME revealed mild L sensory impairment/asymmetry.  Pt appeared aware of deficits but has flat affect re: deficits/HH rehab as he stated "I might do it, but probably not."  No family available to determine baseline cognitive function.  Discussed potential concerns with driving and attention/memory deficits with pt, as concerns for decreased safety awareness d/t inattention once home.  ST f/u while in acute setting for cognitive impairment/changes.  Thank you for this consult.    SLP Assessment  SLP Recommendation/Assessment: Patient needs continued Speech Language Pathology Services SLP Visit Diagnosis: Cognitive communication deficit (R41.841);Attention and concentration deficit Attention and concentration deficit following: Cerebral infarction    Recommendations for follow up therapy are one component of a multi-disciplinary discharge planning process, led by the attending physician.  Recommendations may be updated based on patient status, additional functional criteria and insurance authorization.    Follow Up Recommendations  Home health SLP    Assistance Recommended at Discharge  Intermittent Supervision/Assistance  Functional Status Assessment Patient has had a recent decline in their functional status and demonstrates the ability to make significant improvements in function in a reasonable and predictable amount of time.  Frequency and Duration min 1 x/week  1 week      SLP Evaluation Cognition  Overall Cognitive Status: Impaired/Different from baseline Arousal/Alertness: Awake/alert Orientation Level: Oriented X4 Year: 2023 Month: July Day of  Week: Correct Attention: Sustained Sustained Attention:  Impaired Sustained Attention Impairment: Verbal basic;Functional basic Memory: Impaired Memory Impairment: Retrieval deficit;Decreased short term memory Decreased Short Term Memory: Verbal basic;Functional basic Awareness: Appears intact Problem Solving: Impaired Problem Solving Impairment: Verbal complex;Functional complex Behaviors: Impulsive;Perseveration Safety/Judgment: Other (comment) (questionable; somewhat impulsive, but may have been at baseline; mentioned driving, but "may wait a while", so unsure when returns to home if safety awareness could potentially be an issue)       Comprehension  Auditory Comprehension Overall Auditory Comprehension: Appears within functional limits for tasks assessed Yes/No Questions: Within Functional Limits Commands: Within Functional Limits Conversation: Complex Interfering Components: Attention;Working memory EffectiveTechniques: Repetition Retail banker: Exceptions to [facility] Other Visual Recogniton/Discrimination Comments: L field differences; neglect? c/o slow processing of visual information Reading Comprehension Reading Status: Impaired Word level: Within functional limits Sentence Level: Not tested Paragraph Level: Not tested Functional Environmental (signs, name badge): Within functional limits Interfering Components: Left neglect/inattention Effective Techniques: Large print;Visual cueing    Expression Expression Primary Mode of Expression: Verbal Verbal Expression Overall Verbal Expression: Appears within functional limits for tasks assessed Level of Generative/Spontaneous Verbalization: Conversation Repetition: No impairment Naming: No impairment Interfering Components: Attention Non-Verbal Means of Communication: Not applicable Written Expression Dominant Hand: Right Written Expression: Exceptions to [facility] Interfering Components: Left neglect/inattention   Oral / Motor  Oral Motor/Sensory  Function Overall Oral Motor/Sensory Function: Mild impairment Facial ROM: Reduced left Facial Symmetry: Abnormal symmetry left;Other (Comment) (pt with hx of multiple R facial surgeries as a child) Facial Sensation: Reduced left Lingual ROM: Reduced left (Pt reports R facial involvement d/t multiple surgeries prior to CVA) Lingual Symmetry: Abnormal symmetry left Lingual Sensation: Reduced Motor Speech Overall Motor Speech: Appears within functional limits for tasks assessed Respiration: Within functional limits Phonation: Normal Resonance: Within functional limits Articulation: Within functional limitis Intelligibility: Intelligible Motor Planning: Witnin functional limits Motor Speech Errors: Not applicable Interfering Components: Premorbid status            Elvina Sidle, M.S., CCC-SLP 11/26/2021, 11:26 AM

## 2021-11-26 NOTE — Progress Notes (Addendum)
STROKE TEAM PROGRESS NOTE   INTERVAL HISTORY Patient is laying in bed in NAD. Exam similar to yesterday, still with some mild numbness of left arm. And ataxia in left arm.  No new neurological events overnight.   Vitals:   11/25/21 2008 11/26/21 0024 11/26/21 0444 11/26/21 0812  BP: (!) 147/101 (!) 161/106 (!) 159/95 138/88  Pulse: 73 63 64 65  Resp: '16 17 17 16  '$ Temp: 98.3 F (36.8 C) 98.3 F (36.8 C) 98.4 F (36.9 C) (!) 97.4 F (36.3 C)  TempSrc: Oral Oral Oral Oral  SpO2: 99% 100% 100% 99%  Weight:      Height:       CBC:  Recent Labs  Lab 11/24/21 1834  WBC 9.4  NEUTROABS 5.6  HGB 14.8  HCT 43.8  MCV 91.8  PLT 272    Basic Metabolic Panel:  Recent Labs  Lab 11/24/21 1834  NA 137  K 4.2  CL 105  CO2 23  GLUCOSE 126*  BUN 14  CREATININE 1.03  CALCIUM 9.3    Lipid Panel:  Recent Labs  Lab 11/25/21 0451  CHOL 192  TRIG 134  HDL 37*  CHOLHDL 5.2  VLDL 27  LDLCALC 128*    HgbA1c:  Recent Labs  Lab 11/25/21 0451  HGBA1C 5.6    Urine Drug Screen:  Recent Labs  Lab 11/24/21 1818  LABOPIA NONE DETECTED  COCAINSCRNUR NONE DETECTED  LABBENZ NONE DETECTED  AMPHETMU NONE DETECTED  THCU NONE DETECTED  LABBARB NONE DETECTED     Alcohol Level  Recent Labs  Lab 11/24/21 1834  ETH <10     IMAGING past 24 hours ECHOCARDIOGRAM COMPLETE  Result Date: 11/25/2021    ECHOCARDIOGRAM REPORT   Patient Name:   Jay Kelly Date of Exam: 11/25/2021 Medical Rec #:  [id number]      Height:       68.0 in Accession #:    [id number]     Weight:       150.0 lb Date of Birth:  [birth year]      BSA:          1.809 m Patient Age:    74 years       BP:           140/98 mmHg Patient Gender: M              HR:           61 bpm. Exam Location:  Inpatient Procedure: 2D Echo, Cardiac Doppler and Color Doppler Indications:    Stroke I63.9  History:        Patient has no prior history of Echocardiogram examinations.                 Stroke; Risk Factors:Dyslipidemia,  Hypertension and Current                 Smoker.  Sonographer:    Ronny Flurry Referring Phys: [place]  1. Left ventricular ejection fraction, by estimation, is 60 to 65%. The left ventricle has normal function. The left ventricle has no regional wall motion abnormalities. Left ventricular diastolic parameters were normal.  2. Right ventricular systolic function is normal. The right ventricular size is normal.  3. The mitral valve is normal in structure. Trivial mitral valve regurgitation. No evidence of mitral stenosis.  4. The aortic valve is tricuspid. Aortic valve regurgitation is not visualized. No aortic stenosis is present.  5. The inferior  vena cava is normal in size with greater than 50% respiratory variability, suggesting right atrial pressure of 3 mmHg. FINDINGS  Left Ventricle: Left ventricular ejection fraction, by estimation, is 60 to 65%. The left ventricle has normal function. The left ventricle has no regional wall motion abnormalities. The left ventricular internal cavity size was normal in size. There is  no left ventricular hypertrophy. Left ventricular diastolic parameters were normal. Normal left ventricular filling pressure. Right Ventricle: The right ventricular size is normal. No increase in right ventricular wall thickness. Right ventricular systolic function is normal. Left Atrium: Left atrial size was normal in size. Right Atrium: Right atrial size was normal in size. Pericardium: There is no evidence of pericardial effusion. Mitral Valve: The mitral valve is normal in structure. Trivial mitral valve regurgitation. No evidence of mitral valve stenosis. Tricuspid Valve: The tricuspid valve is normal in structure. Tricuspid valve regurgitation is not demonstrated. No evidence of tricuspid stenosis. Aortic Valve: The aortic valve is tricuspid. Aortic valve regurgitation is not visualized. No aortic stenosis is present. Pulmonic Valve: The pulmonic valve was  normal in structure. Pulmonic valve regurgitation is not visualized. No evidence of pulmonic stenosis. Aorta: The aortic root is normal in size and structure. Venous: The inferior vena cava is normal in size with greater than 50% respiratory variability, suggesting right atrial pressure of 3 mmHg. IAS/Shunts: No atrial level shunt detected by color flow Doppler.  LEFT VENTRICLE PLAX 2D LVIDd:         3.90 cm   Diastology LVIDs:         2.80 cm   LV e' medial:  6.85 cm/s LV PW:         1.00 cm   LV e' lateral: 9.25 cm/s LV IVS:        0.90 cm LVOT diam:     2.10 cm LVOT Area:     3.46 cm  RIGHT VENTRICLE TAPSE (M-mode): 2.7 cm LEFT ATRIUM           Index       RIGHT ATRIUM           Index LA diam:      3.30 cm 1.82 cm/m  RA Area:     11.40 cm LA Vol (A4C): 15.6 ml 8.63 ml/m  RA Volume:   25.80 ml  14.27 ml/m   AORTA Ao Root diam: 3.20 cm Ao Asc diam:  3.30 cm  SHUNTS Systemic Diam: 2.10 cm Mihai Croitoru MD Electronically signed by Sanda Klein MD Signature Date/Time: 11/25/2021/10:05:05 AM    Final     PHYSICAL EXAM He is alert and oriented, PERRL, slight upward deviation of his right eye with rightward gaze and diplopia on rightward gaze(chronic due to previous angioma), chronic right facial paralysis, moves all extremities equal 5/5, decreased sensation in left arm. Ataxia in left arm   ASSESSMENT/PLAN Jay Kelly is a 66 y.o. male with history of HTN, HLD, smoking who presents with left side arm numbness and weakness since Friday.   Stroke:  Small right MCA and watershed ischemic infarcts due to High grade Right ICA stenosis CT head 1. No CT evidence for acute intracranial abnormality. 2. Mild atrophy CTA head & neck significant stenosis at the Right ICA bulb, estimated at 65-70%. MRI Patchy small volume acute to early subacute ischemic infarcts involving the cortical and subcortical aspect of the right parieto-occipital region. 2D Echo EF 60-65% Carotid US right ICA are consistent with  an upper range 60-79%  stenosis.  LDL 128 HgbA1c 5.6 VTE prophylaxis - Lovenox No antithrombotic prior to admission, now on aspirin 81 mg daily and clopidogrel 75 mg daily. Further regimen per [facility]. Therapy recommendations: none Disposition:  pending  Right carotid stenosis CTA head & neck significant stenosis at the Right ICA bulb, estimated at 65-70%. Likely cause of stroke [facility]VS Dr. Donzetta Matters on board Plan for right TCAR on Friday Carotid Doppler right ICA 60 to 79% stenosis  On DAPT now, further regimen per [facility].  Hypertension Home meds:  lisinopril  Stable Avoid low BP Long-term BP goal normotensive  Hyperlipidemia Home meds:  none LDL 128, goal < 70 Add crestor '20mg'$    Continue statin at discharge  Tobacco abuse Current smoker Smoking cessation counseling provided Nicotine patch provided Pt is willing to quit  Other Stroke Risk Factors ETOH use, alcohol level <10, advised to drink no more than 1  drink(s) a day  Hospital day # 0  Beulah Gandy DNP, ACNPC-AG  ATTENDING NOTE: I reviewed above note and agree with the assessment and plan. Pt was seen and examined.   No family at bedside.  Patient lying bed initially sleeping but easily arousable.  Neuro stable.  Had carotid Doppler today showed right ICA 60 to 79% stenosis, consistent with CTA.  Plan for Friday TCAR procedure with Dr. Donzetta Matters.  Continue DAPT and statin.  Smoking cessation education again provided.  Aggressive risk factor modification.  We will follow-up with [facility] in 4 weeks.  For detailed assessment and plan, please refer to above/below as I have made changes wherever appropriate.   Neurology will sign off. Please call with questions. Pt will follow up with stroke clinic NP at [facility] in about 4 weeks. Thanks for the consult.   Rosalin Hawking, MD PhD Stroke Neurology 11/26/2021 5:34 PM    To contact Stroke Continuity provider, please refer to [email or web address] After hours, contact General Neurology

## 2021-11-26 NOTE — Progress Notes (Signed)
Carotid artery duplex completed. Refer to "CV Proc" under chart review to view preliminary results.  11/26/2021 11:41 AM Kelby Aline., MHA, RVT, RDCS, RDMS

## 2021-11-26 NOTE — Progress Notes (Addendum)
PROGRESS NOTE    Jay Kelly  PJA:[id number] DOB: Apr 06, 1956 DOA: 11/24/2021 PCP: Trey Sailors, PA    Brief Narrative:  Jay Kelly is a 66 y.o. male with medical history significant of HTN hyperlipidemia and chronic smoking not compliant to his medication presented to hospital with left-sided numbness.  Patient was noted to have acute ischemic stroke and was admitted hospital for further evaluation and treatment.  During work-up patient was noted to have significant right ICA stenosis and vascular surgery was consulted.  At this time, patient has plans for TCAR on 11/28/2021    Assessment and Plan:  Principal Problem:   Acute ischemic stroke [facility]) Active Problems:   HTN (hypertension)   HLD (hyperlipidemia)   Smoking   Noncompliance with medication regimen   Carotid stenosis, symptomatic, with infarction ([place])   * Acute ischemic stroke ([place]) CT head was unremarkable.  CTA head and neck showed significant stenosis of the right ICA bulb at the 65 to 70%.  MRI of the brain showed acute to early subacute ischemic infarct in the right parieto-occipital region.  2D echocardiogram with EF of 60 to 65%.  Carotid with ultrasound showing right ICA stenosis at 60 to 79%.  LDL at 128 and hemoglobin A1c at 5.6.  Patient is currently on aspirin and Plavix.  Vascular surgery has been consulted at this time due to significant carotid stenosis plan for TCAR on Friday  HTN (hypertension) Patient was on permissive hypertension.  Lisinopril on hold.  HLD (hyperlipidemia) LDL at 128.  Crestor 20 mg has been added which will be continued on discharge  Smoking, tobacco abuse Has been started on nicotine patch for  Noncompliance with medication regimen Has been inconsistent with medication.  Emphasized compliance.   DVT prophylaxis: enoxaparin (LOVENOX) injection 40 mg Start: 11/25/21 1000   Code Status:     Code Status: Full Code  Disposition: Home  Status is: Observation  The  patient will require care spanning > 2 midnights and should be moved to inpatient because: Plan for carotid endarterectomy on November 28, 2021.   Family Communication: None at bedside  Consultants:  Vascular surgery Neurology  Procedures:  None  Antimicrobials:  None  Anti-infectives (From admission, onward)    None      Subjective: Today, patient was seen and examined at bedside.  Patient denies any nausea vomiting dizziness chest pain or shortness of breath.  Objective: Vitals:   11/26/21 0024 11/26/21 0444 11/26/21 0812 11/26/21 1130  BP: (!) 161/106 (!) 159/95 138/88 (!) 144/82  Pulse: 63 64 65 (!) 59  Resp: '17 17 16 15  '$ Temp: 98.3 F (36.8 C) 98.4 F (36.9 C) (!) 97.4 F (36.3 C) 98.5 F (36.9 C)  TempSrc: Oral Oral Oral Oral  SpO2: 100% 100% 99% 98%  Weight:      Height:        Intake/Output Summary (Last 24 hours) at 11/26/2021 1446 Last data filed at 11/26/2021 0800 Gross per 24 hour  Intake 200 ml  Output --  Net 200 ml   Filed Weights   11/24/21 1739  Weight: 68 kg    Physical Examination: Body mass index is 22.81 kg/m.  General:  Average built, not in obvious distress HENT:   No scleral pallor or icterus noted. Oral mucosa is moist.  Chest:  Clear breath sounds.  Diminished breath sounds bilaterally. No crackles or wheezes.  CVS: S1 &S2 heard. No murmur.  Regular rate and rhythm. Abdomen: Soft, nontender, nondistended.  Bowel sounds are heard.   Extremities: No cyanosis, clubbing or edema.  Peripheral pulses are palpable. Psych: Alert, awake and oriented, normal mood CNS: Mild right facial deviation noted-chronic.  Moves all extremities.  Decreased sensation left arm. Skin: Warm and dry.  No rashes noted.  Data Reviewed:   CBC: Recent Labs  Lab 11/24/21 1834  WBC 9.4  NEUTROABS 5.6  HGB 14.8  HCT 43.8  MCV 91.8  PLT 287    Basic Metabolic Panel: Recent Labs  Lab 11/24/21 1834  NA 137  K 4.2  CL 105  CO2 23  GLUCOSE 126*   BUN 14  CREATININE 1.03  CALCIUM 9.3    Liver Function Tests: Recent Labs  Lab 11/24/21 1834  AST 18  ALT 22  ALKPHOS 65  BILITOT 0.4  PROT 6.7  ALBUMIN 3.6     Radiology Studies: VAS US CAROTID  Result Date: 11/26/2021 Carotid Arterial Duplex Study Patient Name:  Jay Kelly  Date of Exam:   11/26/2021 Medical Rec #: [id number]       Accession #:    [id number] Date of Birth: 27-Jun-1955       Patient Gender: M Patient Age:   44 years Exam Location:  [facility] Procedure:      VAS US CAROTID Referring Phys: Aldona Bar RHYNE --------------------------------------------------------------------------------  Indications:                            Carotid artery disease and CTA neck                                         showed 65-70% right ICA stenosis. Risk Factors:                           Hypertension, hyperlipidemia. Comparison Study:                       No prior study Pre-Surgical Evaluation & Surgical      Stenosis at RICA only. ICA is normal Correlation                             past the stenosis. Anatomy on the right                                         is within normal limits.Right                                         bifurcation is located near the Hyoid                                         Notch. Performing Technologist: Maudry Mayhew MHA, RDMS, RVT, RDCS  Examination Guidelines: A complete evaluation includes B-mode imaging, spectral Doppler, color Doppler, and power Doppler as needed of all accessible portions of each vessel. Bilateral testing is considered an integral part of a complete examination. Limited examinations for reoccurring indications may be  performed as noted.  Right Carotid Findings: +----------+--------+--------+--------+-----------------------+--------+           PSV cm/sEDV cm/sStenosisPlaque Description     Comments +----------+--------+--------+--------+-----------------------+--------+ CCA Prox  86      19                                               +----------+--------+--------+--------+-----------------------+--------+ CCA Distal71      20              smooth and heterogenous         +----------+--------+--------+--------+-----------------------+--------+ ICA Prox  260     107     60-79%  hypoechoic                      +----------+--------+--------+--------+-----------------------+--------+ ICA Mid   218     81                                              +----------+--------+--------+--------+-----------------------+--------+ ICA Distal146     19                                              +----------+--------+--------+--------+-----------------------+--------+ ECA       99      21                                              +----------+--------+--------+--------+-----------------------+--------+ +----------+--------+-------+----------------+-------------------+           PSV cm/sEDV cmsDescribe        Arm Pressure (mmHG) +----------+--------+-------+----------------+-------------------+ [id number]            Multiphasic, WNL                    +----------+--------+-------+----------------+-------------------+ +---------+--------+--+--------+--+---------+ VertebralPSV cm/s82EDV cm/s27Antegrade +---------+--------+--+--------+--+---------+  Left Carotid Findings: +----------+--------+--------+--------+-------------------------+--------+           PSV cm/sEDV cm/sStenosisPlaque Description       Comments +----------+--------+--------+--------+-------------------------+--------+ CCA Prox  125     37                                                +----------+--------+--------+--------+-------------------------+--------+ CCA Distal80      24                                                +----------+--------+--------+--------+-------------------------+--------+ ICA Prox  73      26              heterogenous and calcific          +----------+--------+--------+--------+-------------------------+--------+ ICA Distal78      33                                                +----------+--------+--------+--------+-------------------------+--------+  ECA       97      18                                                +----------+--------+--------+--------+-------------------------+--------+ +----------+--------+--------+----------------+-------------------+           PSV cm/sEDV cm/sDescribe        Arm Pressure (mmHG) +----------+--------+--------+----------------+-------------------+ Subclavian117             Multiphasic, WNL                    +----------+--------+--------+----------------+-------------------+ +---------+--------+---+--------+--+---------+ VertebralPSV cm/s114EDV cm/s32Antegrade +---------+--------+---+--------+--+---------+   Summary: Right Carotid: Velocities in the right ICA are consistent with an upper range                60-79% stenosis. Left Carotid: Velocities in the left ICA are consistent with a 1-39% stenosis. Vertebrals:  Bilateral vertebral arteries demonstrate antegrade flow. Subclavians: Normal flow hemodynamics were seen in bilateral subclavian              arteries. *See table(s) above for measurements and observations.     Preliminary    CT ANGIO HEAD NECK W WO CM  Addendum Date: 11/25/2021   ADDENDUM REPORT: 11/25/2021 12:06 ADDENDUM: CORRECTION, the hemodynamically significant ICA stenosis in the neck is on the RIGHT side, as described in the Findings section. Impression #2 should read in part: "Significant stenosis at the RIGHT ICA bulb is numerically estimated at 65-70%." Electronically Signed   By: Genevie Ann M.D.   On: 11/25/2021 12:06   Result Date: 11/25/2021 CLINICAL DATA:  66 year old male with left arm weakness. Scattered small infarcts in the right MCA and MCA/PCA watershed on brain MRI today. EXAM: CT ANGIOGRAPHY HEAD AND NECK TECHNIQUE: Multidetector CT imaging  of the head and neck was performed using the standard protocol during bolus administration of intravenous contrast. Multiplanar CT image reconstructions and MIPs were obtained to evaluate the vascular anatomy. Carotid stenosis measurements (when applicable) are obtained utilizing NASCET criteria, using the distal internal carotid diameter as the denominator. RADIATION DOSE REDUCTION: This exam was performed according to the departmental dose-optimization program which includes automated exposure control, adjustment of the mA and/or kV according to patient size and/or use of iterative reconstruction technique. CONTRAST:  88m OMNIPAQUE IOHEXOL 350 MG/ML SOLN COMPARISON:  Brain MRI 0050 hours today. Head CT yesterday. FINDINGS: CTA NECK Skeleton: Carious dentition. 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 Evidence of previous ORIF at the right zygomaticomaxillary confluence. Paranasal sinus mucoperiosteal thickening. Tympanic cavities are clear. Ordinary cervical spine degeneration. No acute osseous abnormality identified. Upper chest: Multiple 4 mm bilateral upper lobe solid pulmonary nodules (series 5, image 162 on the left and image 174 on the right. Negative visible mediastinum. Other neck: Chronic appearing skin and subcutaneous thickening along the right face. Atrophied or absent right parotid gland. No definite acute neck soft tissue finding. Aortic arch: Calcified aortic atherosclerosis. 3 vessel arch configuration. Right carotid system: Soft and calcified brachiocephalic artery and right CCA origin plaque without stenosis. Severe soft more so than calcified atherosclerosis at the right ICA origin and bulb, with maximal stenosis at the distal bulb numerically estimated at 65-70 % with respect to the distal vessel (series 6, image 308). Right ICA remains patent to the  skull base.  Left carotid system: Mild plaque at the left CCA origin, no stenosis. Mild to moderate plaque at the left carotid bifurcation. Partially retropharyngeal course of the cervical left ICA but no significant stenosis. Vertebral arteries: Mild proximal right subclavian artery plaque without stenosis. Normal right vertebral artery origin. Tortuous right vertebral artery is patent to the skull base with no significant plaque or stenosis. Proximal left subclavian artery mild to moderate atherosclerosis without stenosis. Normal left vertebral artery origin. Tortuous and fairly codominant left vertebral artery is patent to the skull base without significant plaque or stenosis. CTA HEAD Posterior circulation: Mildly tortuous distal vertebral arteries are patent to the basilar without stenosis. Normal right PICA origin. Left AICA appears dominant. Patent basilar artery with plaque but no significant stenosis. Patent SCA and PCA origins. Small right posterior communicating artery, the left is diminutive or absent. Left PCA branches are within normal limits. Right PCA branches are also within normal limits. Anterior circulation: Both ICA siphons are patent with moderate calcified plaque. Only mild left siphon stenosis. Similar mild right siphon stenosis. Patent carotid termini, MCA and ACA origins. Left ACA A1 segment is dominant. Right ACA A1 segment is fenestrated. Anterior communicating artery and bilateral ACA branches are within normal limits. Left MCA M1 segment is tortuous and patent without stenosis. Left MCA branches are within normal limits. Right MCA M1 segment is patent to the bifurcation without stenosis. No right MCA branch occlusion is identified. There is mild right MCA branch irregularity. Venous sinuses: Patent. Anatomic variants: Dominant left and fenestrated right ACA A1 segments. Review of the MIP images confirms the above findings IMPRESSION: 1. Negative for large vessel occlusion. 2. Widespread  atherosclerosis in the neck and at the skull base. Significant stenosis at the Left ICA bulb is numerically estimated at 65-70%. But no other significant arterial stenosis identified in the head or neck. Aortic Atherosclerosis (ICD10-I70.0). 3. Multiple 4 mm bilateral upper lobe solid pulmonary nodules. If patient is low risk for malignancy, no routine follow-up imaging is recommended; if patient is high risk for malignancy, a non-contrast Chest CT at 12 months is optional. If performed and the nodule is stable at 12 months, no further follow-up is recommended. Reference: Radiology. 2017; 284(1):228-43. 4. 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 Prior ORIF at the right zygomaticomaxillary confluence. Carious dentition. Electronically Signed: By: Genevie Ann M.D. On: 11/25/2021 04:19   ECHOCARDIOGRAM COMPLETE  Result Date: 11/25/2021    ECHOCARDIOGRAM REPORT   Patient Name:   Jay Kelly Date of Exam: 11/25/2021 Medical Rec #:  [id number]      Height:       68.0 in Accession #:    [id number]     Weight:       150.0 lb Date of Birth:  03-14-1956      BSA:          1.809 m Patient Age:    45 years       BP:           140/98 mmHg Patient Gender: M              HR:           61 bpm. Exam Location:  Inpatient Procedure: 2D Echo, Cardiac Doppler and Color Doppler Indications:    Stroke I63.9  History:        Patient has no prior history of Echocardiogram examinations.  Stroke; Risk Factors:Dyslipidemia, Hypertension and Current                 Smoker.  Sonographer:    Ronny Flurry Referring Phys: [place]  1. Left ventricular ejection fraction, by estimation, is 60 to 65%. The left ventricle has normal function. The left ventricle has no regional wall motion abnormalities. Left ventricular diastolic parameters were normal.  2. Right ventricular systolic function is normal. The right ventricular size is normal.  3. The mitral valve is normal in structure.  Trivial mitral valve regurgitation. No evidence of mitral stenosis.  4. The aortic valve is tricuspid. Aortic valve regurgitation is not visualized. No aortic stenosis is present.  5. The inferior vena cava is normal in size with greater than 50% respiratory variability, suggesting right atrial pressure of 3 mmHg. FINDINGS  Left Ventricle: Left ventricular ejection fraction, by estimation, is 60 to 65%. The left ventricle has normal function. The left ventricle has no regional wall motion abnormalities. The left ventricular internal cavity size was normal in size. There is  no left ventricular hypertrophy. Left ventricular diastolic parameters were normal. Normal left ventricular filling pressure. Right Ventricle: The right ventricular size is normal. No increase in right ventricular wall thickness. Right ventricular systolic function is normal. Left Atrium: Left atrial size was normal in size. Right Atrium: Right atrial size was normal in size. Pericardium: There is no evidence of pericardial effusion. Mitral Valve: The mitral valve is normal in structure. Trivial mitral valve regurgitation. No evidence of mitral valve stenosis. Tricuspid Valve: The tricuspid valve is normal in structure. Tricuspid valve regurgitation is not demonstrated. No evidence of tricuspid stenosis. Aortic Valve: The aortic valve is tricuspid. Aortic valve regurgitation is not visualized. No aortic stenosis is present. Pulmonic Valve: The pulmonic valve was normal in structure. Pulmonic valve regurgitation is not visualized. No evidence of pulmonic stenosis. Aorta: The aortic root is normal in size and structure. Venous: The inferior vena cava is normal in size with greater than 50% respiratory variability, suggesting right atrial pressure of 3 mmHg. IAS/Shunts: No atrial level shunt detected by color flow Doppler.  LEFT VENTRICLE PLAX 2D LVIDd:         3.90 cm   Diastology LVIDs:         2.80 cm   LV e' medial:  6.85 cm/s LV PW:          1.00 cm   LV e' lateral: 9.25 cm/s LV IVS:        0.90 cm LVOT diam:     2.10 cm LVOT Area:     3.46 cm  RIGHT VENTRICLE TAPSE (M-mode): 2.7 cm LEFT ATRIUM           Index       RIGHT ATRIUM           Index LA diam:      3.30 cm 1.82 cm/m  RA Area:     11.40 cm LA Vol (A4C): 15.6 ml 8.63 ml/m  RA Volume:   25.80 ml  14.27 ml/m   AORTA Ao Root diam: 3.20 cm Ao Asc diam:  3.30 cm  SHUNTS Systemic Diam: 2.10 cm Dani Gobble Croitoru MD Electronically signed by Sanda Klein MD Signature Date/Time: 11/25/2021/10:05:05 AM    Final    MR Cervical Spine Wo Contrast  Result Date: 11/25/2021 CLINICAL DATA:  Initial evaluation for acute myelopathy. EXAM: MRI CERVICAL SPINE WITHOUT CONTRAST TECHNIQUE: Multiplanar, multisequence MR imaging of the cervical spine  was performed. No intravenous contrast was administered. COMPARISON:  None Available. FINDINGS: Alignment: Reversal of the normal cervical lordosis. Trace facet mediated anterolisthesis of C3 on C4 and C7 on T1. Vertebrae: Vertebral body height maintained without acute or chronic fracture. Bone marrow signal intensity heterogeneous but overall within normal limits. No worrisome osseous lesions. No abnormal marrow edema. Cord: Patchy signal abnormality seen involving the bilateral hemi cord at C4-5 and C5-6, most consistent with chronic myelomalacia (series 8, images 24, 29). Findings likely due to compressive stenosis at these levels. Visualized cord otherwise within normal limits. Posterior Fossa, vertebral arteries, paraspinal tissues: Craniocervical junction normal. Paraspinous soft tissues within normal limits. Normal flow voids seen within the vertebral arteries bilaterally. Asymmetric prominence of the fatty soft tissues of the visualized right face noted. Disc levels: C2-C3: Negative interspace. Moderate left with mild right facet hypertrophy. No spinal stenosis. Mild left C3 foraminal stenosis. Right neural foramina remains patent. C3-C4: Trace anterolisthesis.  Mild diffuse disc bulge, eccentric to the right. Associated endplate and uncovertebral spurring with right-sided facet arthrosis. Resultant mild-to-moderate spinal stenosis. Severe right with mild left C4 foraminal stenosis. C4-C5: Degenerative intervertebral disc space narrowing. Broad-based right paracentral disc osteophyte complex indents and partially faces the ventral thecal sac. Associated superior migration of soft disc material noted (series 8, image 22). Mild ligament flavum hypertrophy. Resultant flattening of the cervical cord with associated cord signal changes, compatible with chronic myelomalacia. Moderate to severe spinal stenosis with the thecal sac measuring 6-7 mm in AP diameter. Severe left with moderate right C5 foraminal stenosis. C5-C6: Degenerative intervertebral disc space narrowing. Broad base right paracentral disc osteophyte complex indents the right ventral thecal sac. Secondary flattening of the ventral cord. Associated cord signal changes compatible with compressive myelomalacia. Moderate to severe spinal stenosis. Moderate left with severe right C6 foraminal narrowing. C6-C7: Degenerative intervertebral disc space narrowing. Broad-based central to right paracentral disc osteophyte complex flattens and indents the ventral thecal sac. Mild cord flattening without cord signal changes. Mild to moderate spinal stenosis. Moderate left with mild right C7 foraminal narrowing. C7-T1: Mild disc bulge with uncovertebral spurring, greater on the left. Left greater than right facet hypertrophy. No significant spinal stenosis. Severe left C8 foraminal narrowing. Right neural foramen is patent. Visualized upper thoracic spine demonstrates no significant finding. IMPRESSION: 1. Degenerative spondylosis at C4-5 and C5-6 with resultant moderate to severe spinal stenosis, with severe left C5 and right C6 foraminal narrowing. Patchy signal abnormality involving the cervical spinal cord at these levels  consistent with chronic myelomalacia. 2. Degenerative spondylosis at C3-4 and C6-7 with resultant mild to moderate spinal stenosis, with severe right C4 and moderate left C7 foraminal stenosis. 3. Severe left C8 foraminal stenosis related to disc bulging and facet hypertrophy. Electronically Signed   By: Jeannine Boga M.D.   On: 11/25/2021 02:20   MR BRAIN WO CONTRAST  Result Date: 11/25/2021 CLINICAL DATA:  Initial evaluation for neuro deficit, stroke suspected. EXAM: MRI HEAD WITHOUT CONTRAST TECHNIQUE: Multiplanar, multiecho pulse sequences of the brain and surrounding structures were obtained without intravenous contrast. COMPARISON:  Prior CT from 11/24/2021. FINDINGS: Brain: Cerebral volume within normal limits. Right cerebellar hemisphere is dysplastic in appearance. No significant cerebral white matter disease for age. Patchy foci of restricted diffusion involving the cortical and subcortical aspect of the right parieto-occipital region, consistent with acute to early subacute ischemic infarcts. No associated mass effect or significant hemorrhage. No other evidence for acute or subacute ischemia. Gray-white matter differentiation otherwise maintained. No other areas  of chronic cortical infarction. No other acute or chronic intracranial blood products. No mass lesion, mass effect or midline shift. No hydrocephalus or extra-axial fluid collection. Pituitary gland suprasellar region within normal limits. Midline structures intact. Vascular: Major intracranial vascular flow voids are maintained. Skull and upper cervical spine: Craniocervical junction within normal limits. Bone marrow signal intensity normal. Asymmetric thickening of the right frontal calvarium noted. Scalp soft tissues demonstrate no acute finding. Sinuses/Orbits: Asymmetric right-sided proptosis. Diffuse prominence of the subcutaneous fat of the visualized right face. Scattered mucosal thickening noted about the ethmoidal air cells  and maxillary sinuses. Small right mastoid effusion noted. Visualized nasopharynx unremarkable. Other: None. IMPRESSION: Patchy small volume acute to early subacute ischemic infarcts involving the cortical and subcortical aspect of the right parieto-occipital region. No associated mass effect or hemorrhage. Electronically Signed   By: Jeannine Boga M.D.   On: 11/25/2021 02:09   CT HEAD WO CONTRAST  Result Date: 11/24/2021 CLINICAL DATA:  Left arm weakness EXAM: CT HEAD WITHOUT CONTRAST TECHNIQUE: Contiguous axial images were obtained from the base of the skull through the vertex without intravenous contrast. RADIATION DOSE REDUCTION: This exam was performed according to the departmental dose-optimization program which includes automated exposure control, adjustment of the mA and/or kV according to patient size and/or use of iterative reconstruction technique. COMPARISON:  None Available. FINDINGS: Brain: No acute territorial infarction, hemorrhage, or intracranial mass. Mild atrophy. The ventricles are nonenlarged. Vascular: No hyperdense vessels.  Carotid vascular calcification. Skull: No acute fracture. Postsurgical and posttraumatic changes of the right maxillary sinus and anterior zygomatic arch. Bony hypertrophy of the right zygomatic arch and partially visualized TMJ as well as the lateral right orbital wall, question due to prior trauma or potential fibrous dysplasia. Sinuses/Orbits: Mucosal thickening.  No acute osseous abnormality. Other: None IMPRESSION: 1. No CT evidence for acute intracranial abnormality. 2. Mild atrophy Electronically Signed   By: Donavan Foil M.D.   On: 11/24/2021 19:56      LOS: 0 days    Flora Lipps, MD [facility] Available via Epic secure chat 7am-7pm After these hours, please refer to coverage provider listed on [email or web address] 11/26/2021, 2:46 PM

## 2021-11-26 NOTE — TOC Initial Note (Signed)
Transition of Care [facility]) - Initial/Assessment Note    Patient Details  Name: Jay Kelly MRN: [id number] Date of Birth: 1955-07-26  [facility]) CM/SW Contact:    Pollie Friar, RN Phone Number: 11/26/2021, 2:42 PM  Clinical Narrative:                 Pt is from home alone. Pt states he sees his ex-spouse almost daily. She can provide needed transportation. Pt manages his own medications and denies any issues.  Recommendations are for outpatient therapy. Pt currently is refusing. He is to have TCAR on Friday and PT/OT to re-assess after.  TOC following.  Expected Discharge Plan: Home/Self Care Barriers to Discharge: Continued Medical Work up   Patient Goals and CMS Choice        Expected Discharge Plan and Services Expected Discharge Plan: Home/Self Care   Discharge Planning Services: CM Consult   Living arrangements for the past 2 months: Single Family Home                                      Prior Living Arrangements/Services Living arrangements for the past 2 months: Single Family Home Lives with:: Self Patient language and need for interpreter reviewed:: Yes Do you feel safe going back to the place where you live?: Yes            Criminal Activity/Legal Involvement Pertinent to Current Situation/Hospitalization: No - Comment as needed  Activities of Daily Living      Permission Sought/Granted                  Emotional Assessment Appearance:: Appears stated age Attitude/Demeanor/Rapport: Engaged Affect (typically observed): Accepting Orientation: : Oriented to Self, Oriented to Place, Oriented to  Time, Oriented to Situation   Psych Involvement: No (comment)  Admission diagnosis:  Acute ischemic stroke [facility]) [I63.9] Cerebrovascular accident (CVA), unspecified mechanism ([place]) [I63.9] Patient Active Problem List   Diagnosis Date Noted   Acute ischemic stroke ([place]) 11/25/2021   HLD (hyperlipidemia) 11/25/2021   HTN  (hypertension) 11/25/2021   Smoking 11/25/2021   Noncompliance with medication regimen 11/25/2021   PCP:  Trey Sailors, PA Pharmacy:   [facility] [place], [place] - [address] AT [facility] [address] [place] [address] Phone: [phone] Fax: [phone]  [id number] [place] [id number]: [phone]: [phone]    Social Determinants of Health (SDOH) Interventions    Readmission Risk Interventions     No data to display

## 2021-11-26 NOTE — Progress Notes (Signed)
Occupational Therapy Treatment Patient Details Name: Jay Kelly MRN: [id number] DOB: 04/01/1956 Today's Date: 11/26/2021   History of present illness 66 yo male presented to ED on 7/10 with left arm numbness. MRI showed acute to subacute rt parieto- occipital infarcts. PMH including HTN, fibromyalgia   OT comments  Pt making excellent progress towards OT goals this session. Pt continues to present with impaired LUE coordination/strength, impaired vision and impaired cognition . Session focus on implementing [facility] HEP as well as theraputty HEP to increase intrinsic strength and to provide increased proprioception to L hand as pt reports numbness in LUE. Pt completed therex as indicate below with HEP provided with pt able to teach back therex to increase carryover. Pt continues to report that his vision is "off" but doesn't elaborate on deficits, pt reports hes always had trouble with his R eye.  Pt would continue to benefit from skilled occupational therapy while admitted and after d/c to address the below listed limitations in order to improve overall functional mobility and facilitate independence with BADL participation. DC plan remains appropriate, will follow acutely per POC.      Recommendations for follow up therapy are one component of a multi-disciplinary discharge planning process, led by the attending physician.  Recommendations may be updated based on patient status, additional functional criteria and insurance authorization.    Follow Up Recommendations  Outpatient OT (Neuro OP)    Assistance Recommended at Discharge PRN  Patient can return home with the following      Equipment Recommendations  None recommended by OT    Recommendations for Other Services      Precautions / Restrictions Precautions Precautions: Other (comment) Precaution Comments: Reporting vision issues Restrictions Weight Bearing Restrictions: No       Mobility Bed Mobility Overal bed mobility:  Independent                  Transfers Overall transfer level: Independent                       Balance Overall balance assessment: Independent                                         ADL either performed or assessed with clinical judgement   ADL                                         General ADL Comments: Pt performing at Mod I level with increased time for decreased functional use of LUE and vision deficits.    Extremity/Trunk Assessment Upper Extremity Assessment Upper Extremity Assessment: LUE deficits/detail LUE Deficits / Details: impaired digit extension, and impaired oppostion LUE Coordination: decreased fine motor   Lower Extremity Assessment Lower Extremity Assessment: Defer to PT evaluation        Vision Baseline Vision/History: 1 Wears glasses (readers) Patient Visual Report: Other (comment) (pt continues to report that his vision is "off" but doesn't elabroate much on deficits, pt reports always having issues with R eye. pt reports his vision "lifts") Vision Assessment?: Vision impaired- to be further tested in functional context Additional Comments: pt completed line bisection test on mirror with no deficits noted, pt able to read from menu and HEP with no deficits noted.   Perception Perception Perception:  Within Functional Limits   Praxis Praxis Praxis: Intact    Cognition Arousal/Alertness: Awake/alert Behavior During Therapy: WFL for tasks assessed/performed, Impulsive (impulsive but likely baseline) Overall Cognitive Status: Impaired/Different from baseline Area of Impairment: Following commands, Problem solving, Awareness                       Following Commands: Follows one step commands with increased time   Awareness: Emergent, Anticipatory Problem Solving: Slow processing, Requires verbal cues General Comments: pt requires increased time to follow commands related to HEP, able to  read HEP and teach back therex with MIN cues.        Exercises Other Exercises Other Exercises: Exercises  - Thumb Opposition  - 1 x daily - 7 x weekly - 3 sets - 10 reps  - Wrist AROM Flexion Extension  - 1 x daily - 7 x weekly - 3 sets - 10 reps  - Finger Spreading  - 1 x daily - 7 x weekly - 3 sets - 10 reps  - Wrist AROM Radial Ulnar Deviation  - 1 x daily - 7 x weekly - 3 sets - 10 reps  - Finger MP Flexion AROM  - 1 x daily - 7 x weekly - 3 sets - 10 reps  - Seated Finger Composite Flexion Extension  - 1 x daily - 7 x weekly - 3 sets - 10 reps  - Seated Single Finger Extension  - 1 x daily - 7 x weekly - 3 sets - 10 reps Other Exercises: theraputty HEP with an emphasis on intrinsic strength    Shoulder Instructions       General Comments issued pt theraputty and HEP    Pertinent Vitals/ Pain       Pain Assessment Pain Assessment: No/denies pain  Home Living                                          Prior Functioning/Environment              Frequency  Min 3X/week        Progress Toward Goals  OT Goals(current goals can now be found in the care plan section)  Progress towards OT goals: Progressing toward goals  Acute Rehab OT Goals Patient Stated Goal: none stated Time For Goal Achievement: 12/09/21 Potential to Achieve Goals: Good  Plan Discharge plan remains appropriate;Frequency remains appropriate    Co-evaluation                 AM-PAC OT "6 Clicks" Daily Activity     Outcome Measure   Help from another person eating meals?: None Help from another person taking care of personal grooming?: None Help from another person toileting, which includes using toliet, bedpan, or urinal?: None Help from another person bathing (including washing, rinsing, drying)?: None Help from another person to put on and taking off regular upper body clothing?: None Help from another person to put on and taking off regular lower body clothing?: None 6  Click Score: 24    End of Session    OT Visit Diagnosis: Hemiplegia and hemiparesis;Other symptoms and signs involving cognitive function;Low vision, both eyes (H54.2) Hemiplegia - Right/Left: Left Hemiplegia - dominant/non-dominant: Non-Dominant Hemiplegia - caused by: Cerebral infarction   Activity Tolerance Patient tolerated treatment well   Patient Left in bed;with call bell/phone within reach  Nurse Communication          Time: 0315-9458 OT Time Calculation (min): 12 min  Charges: OT General Charges $OT Visit: 1 Visit OT Treatments $Therapeutic Exercise: 8-22 mins  Harley Alto., COTA/L Acute Rehabilitation Services [phone]  Precious Haws 11/26/2021, 10:37 AM

## 2021-11-26 NOTE — Progress Notes (Addendum)
On call provider([facility]) notified of the pt vials taking at midnight, no new orders at this time, will continue to monitor.

## 2021-11-26 NOTE — Progress Notes (Signed)
  Progress Note    11/26/2021 9:05 AM * No surgery found *  Subjective:  no new complaints  Vitals:   11/26/21 0444 11/26/21 0812  BP: (!) 159/95 138/88  Pulse: 64 65  Resp: 17 16  Temp: 98.4 F (36.9 C) (!) 97.4 F (36.3 C)  SpO2: 100% 99%    Physical Exam: Aaox3 eating breakfast Left arm improving  CBC    Component Value Date/Time   WBC 9.4 11/24/2021 1834   RBC 4.77 11/24/2021 1834   HGB 14.8 11/24/2021 1834   HCT 43.8 11/24/2021 1834   PLT 276 11/24/2021 1834   MCV 91.8 11/24/2021 1834   MCH 31.0 11/24/2021 1834   MCHC 33.8 11/24/2021 1834   RDW 12.7 11/24/2021 1834   LYMPHSABS 2.5 11/24/2021 1834   MONOABS 0.6 11/24/2021 1834   EOSABS 0.6 (H) 11/24/2021 1834   BASOSABS 0.1 11/24/2021 1834    BMET    Component Value Date/Time   NA 137 11/24/2021 1834   K 4.2 11/24/2021 1834   CL 105 11/24/2021 1834   CO2 23 11/24/2021 1834   GLUCOSE 126 (H) 11/24/2021 1834   BUN 14 11/24/2021 1834   CREATININE 1.03 11/24/2021 1834   CALCIUM 9.3 11/24/2021 1834   GFRNONAA >60 11/24/2021 1834   GFRAA 32 (L) 10/17/2017 1540    INR    Component Value Date/Time   INR 1.1 11/24/2021 1834     Intake/Output Summary (Last 24 hours) at 11/26/2021 0905 Last data filed at 11/26/2021 0800 Gross per 24 hour  Intake 200 ml  Output --  Net 200 ml     Assessment:  66 y.o. male is here with right sided cva and high grade right ica stenosis  Plan: R TCAR on Friday  Continue asa, plavix and statin   Bobbye Reinitz C. Donzetta Matters, MD [facility] [place] Office: [phone] Pager: [phone]  11/26/2021 9:05 AM

## 2021-11-27 DIAGNOSIS — E785 Hyperlipidemia, unspecified: Secondary | ICD-10-CM | POA: Diagnosis not present

## 2021-11-27 DIAGNOSIS — I6521 Occlusion and stenosis of right carotid artery: Secondary | ICD-10-CM

## 2021-11-27 DIAGNOSIS — I10 Essential (primary) hypertension: Secondary | ICD-10-CM | POA: Diagnosis not present

## 2021-11-27 DIAGNOSIS — Z91148 Patient's other noncompliance with medication regimen for other reason: Secondary | ICD-10-CM | POA: Diagnosis not present

## 2021-11-27 DIAGNOSIS — I63239 Cerebral infarction due to unspecified occlusion or stenosis of unspecified carotid arteries: Secondary | ICD-10-CM | POA: Diagnosis not present

## 2021-11-27 LAB — BASIC METABOLIC PANEL
Anion gap: 9 (ref 5–15)
BUN: 15 mg/dL (ref 8–23)
CO2: 26 mmol/L (ref 22–32)
Calcium: 9.2 mg/dL (ref 8.9–10.3)
Chloride: 103 mmol/L (ref 98–111)
Creatinine, Ser: 0.86 mg/dL (ref 0.61–1.24)
GFR, Estimated: >60 mL/min (ref >60)
Glucose, Bld: 127 mg/dL — ABNORMAL HIGH (ref 70–99)
Potassium: 3.9 mmol/L (ref 3.5–5.1)
Sodium: 138 mmol/L (ref 135–145)

## 2021-11-27 LAB — CBC
HCT: 42.7 % (ref 39.0–52.0)
Hemoglobin: 14.7 g/dL (ref 13.0–17.0)
MCH: 30.7 pg (ref 26.0–34.0)
MCHC: 34.4 g/dL (ref 30.0–36.0)
MCV: 89.1 fL (ref 80.0–100.0)
Platelets: 286 10*3/uL (ref 150–400)
RBC: 4.79 MIL/uL (ref 4.22–5.81)
RDW: 12.3 % (ref 11.5–15.5)
WBC: 9.6 10*3/uL (ref 4.0–10.5)
nRBC: 0 % (ref 0.0–0.2)

## 2021-11-27 LAB — MAGNESIUM: Magnesium: 2.2 mg/dL (ref 1.7–2.4)

## 2021-11-27 MED ORDER — MELATONIN 5 MG PO TABS
5.0000 mg | ORAL_TABLET | Freq: Every evening | ORAL | Status: DC | PRN
  Administered 2021-11-27 – 2021-11-28 (×2): 5 mg via ORAL
  Filled 2021-11-27 (×2): qty 1

## 2021-11-27 NOTE — Progress Notes (Signed)
PROGRESS NOTE    Jay Kelly  LKG:[id number] DOB: 31-Jan-1956 DOA: 11/24/2021 PCP: Trey Sailors, PA    Brief Narrative:  Jay Kelly is a 66 y.o. male with medical history significant of HTN hyperlipidemia and chronic smoking not compliant to his medication presented to hospital with left-sided numbness.  Patient was noted to have acute ischemic stroke and was admitted hospital for further evaluation and treatment.  During work-up patient was noted to have significant right ICA stenosis and vascular surgery was consulted.  At this time, patient has plans for TCAR on 11/28/2021    Assessment and Plan:  Principal Problem:   Acute ischemic stroke [facility]) Active Problems:   HTN (hypertension)   HLD (hyperlipidemia)   Smoking   Noncompliance with medication regimen   Carotid stenosis, symptomatic, with infarction ([place])   * Acute ischemic stroke ([place]) CT head was unremarkable.  CTA head and neck showed significant stenosis of the right ICA bulb at the 65 to 70%.  MRI of the brain showed acute to early subacute ischemic infarct in the right parieto-occipital region.  2D echocardiogram with EF of 60 to 65%.  Carotid with ultrasound showing right ICA stenosis at 60 to 79%.  LDL at 128 and hemoglobin A1c at 5.6.  Patient is currently on aspirin and Plavix.  Vascular surgery on board for significant carotid stenosis and plan for TCAR on 11/28/21  HTN (hypertension) Patient was on permissive hypertension.  Lisinopril on hold.  Latest blood pressure of 139/88  HLD (hyperlipidemia) LDL at 128.  Continue Crestor  Smoking, tobacco abuse Continue nicotine patch   Noncompliance with medication regimen    DVT prophylaxis: enoxaparin (LOVENOX) injection 40 mg Start: 11/25/21 1000   Code Status:     Code Status: Full Code  Disposition: Home  Status is: Inpatient  The patient is inpatient because: Plan for TCAR on November 28, 2021.   Family Communication: None at  bedside  Consultants:  Vascular surgery Neurology  Procedures:  None yet  Antimicrobials:  None  Anti-infectives (From admission, onward)    None      Subjective: Today, patient was seen and examined at bedside.  Denies any dizziness lightheadedness weakness.   Objective: Vitals:   11/27/21 0007 11/27/21 0411 11/27/21 0756 11/27/21 1107  BP: 140/86 132/77 133/85 139/88  Pulse: 62 60 60 63  Resp: '16 17 16 18  '$ Temp: 98.6 F (37 C) 98.4 F (36.9 C) 97.7 F (36.5 C) 98 F (36.7 C)  TempSrc: Oral Oral Oral Oral  SpO2: 100% 100% 99% 96%  Weight:      Height:        Intake/Output Summary (Last 24 hours) at 11/27/2021 1137 Last data filed at 11/27/2021 0915 Gross per 24 hour  Intake 240 ml  Output --  Net 240 ml    Filed Weights   11/24/21 1739  Weight: 68 kg    Physical Examination: Body mass index is 22.81 kg/m.   General:  Average built, not in obvious distress HENT:   No scleral pallor or icterus noted. Oral mucosa is moist.  Chest:  Clear breath sounds.  Diminished breath sounds bilaterally. No crackles or wheezes.  CVS: S1 &S2 heard. No murmur.  Regular rate and rhythm. Abdomen: Soft, nontender, nondistended.  Bowel sounds are heard.   Extremities: No cyanosis, clubbing or edema.  Peripheral pulses are palpable. Psych: Alert, awake and oriented, normal mood CNS: Right facial deviation with ectropion chronic.  Reduced sensation left arm .  Skin: Warm and dry.  No rashes noted.  Data Reviewed:   CBC: Recent Labs  Lab 11/24/21 1834 11/27/21 0142  WBC 9.4 9.6  NEUTROABS 5.6  --   HGB 14.8 14.7  HCT 43.8 42.7  MCV 91.8 89.1  PLT 276 286     Basic Metabolic Panel: Recent Labs  Lab 11/24/21 1834 11/27/21 0142  NA 137 138  K 4.2 3.9  CL 105 103  CO2 23 26  GLUCOSE 126* 127*  BUN 14 15  CREATININE 1.03 0.86  CALCIUM 9.3 9.2  MG  --  2.2     Liver Function Tests: Recent Labs  Lab 11/24/21 1834  AST 18  ALT 22  ALKPHOS 65   BILITOT 0.4  PROT 6.7  ALBUMIN 3.6      Radiology Studies: VAS US CAROTID  Result Date: 11/26/2021 Carotid Arterial Duplex Study Patient Name:  Jay Kelly  Date of Exam:   11/26/2021 Medical Rec #: [id number]       Accession #:    [id number] Date of Birth: [birth year]       Patient Gender: M Patient Age:   69 years Exam Location:  [facility] Procedure:      VAS US CAROTID Referring Phys: Aldona Bar RHYNE --------------------------------------------------------------------------------  Indications:                            Carotid artery disease and CTA neck                                         showed 65-70% right ICA stenosis. Risk Factors:                           Hypertension, hyperlipidemia. Comparison Study:                       No prior study Pre-Surgical Evaluation & Surgical      Stenosis at RICA only. ICA is normal Correlation                             past the stenosis. Anatomy on the right                                         is within normal limits.Right                                         bifurcation is located near the Hyoid                                         Notch. Performing Technologist: Maudry Mayhew MHA, RDMS, RVT, RDCS  Examination Guidelines: A complete evaluation includes B-mode imaging, spectral Doppler, color Doppler, and power Doppler as needed of all accessible portions of each vessel. Bilateral testing is considered an integral part of a complete examination. Limited examinations for reoccurring indications may be performed as noted.  Right Carotid Findings: +----------+--------+--------+--------+-----------------------+--------+  PSV cm/sEDV cm/sStenosisPlaque Description     Comments +----------+--------+--------+--------+-----------------------+--------+ CCA Prox  86      19                                              +----------+--------+--------+--------+-----------------------+--------+ CCA Distal71      20               smooth and heterogenous         +----------+--------+--------+--------+-----------------------+--------+ ICA Prox  260     107     60-79%  hypoechoic                      +----------+--------+--------+--------+-----------------------+--------+ ICA Mid   218     81                                              +----------+--------+--------+--------+-----------------------+--------+ ICA Distal146     19                                              +----------+--------+--------+--------+-----------------------+--------+ ECA       99      21                                              +----------+--------+--------+--------+-----------------------+--------+ +----------+--------+-------+----------------+-------------------+           PSV cm/sEDV cmsDescribe        Arm Pressure (mmHG) +----------+--------+-------+----------------+-------------------+ [id number]            Multiphasic, WNL                    +----------+--------+-------+----------------+-------------------+ +---------+--------+--+--------+--+---------+ VertebralPSV cm/s82EDV cm/s27Antegrade +---------+--------+--+--------+--+---------+  Left Carotid Findings: +----------+--------+--------+--------+-------------------------+--------+           PSV cm/sEDV cm/sStenosisPlaque Description       Comments +----------+--------+--------+--------+-------------------------+--------+ CCA Prox  125     37                                                +----------+--------+--------+--------+-------------------------+--------+ CCA Distal80      24                                                +----------+--------+--------+--------+-------------------------+--------+ ICA Prox  73      26              heterogenous and calcific         +----------+--------+--------+--------+-------------------------+--------+ ICA Distal78      33                                                 +----------+--------+--------+--------+-------------------------+--------+  ECA       97      18                                                +----------+--------+--------+--------+-------------------------+--------+ +----------+--------+--------+----------------+-------------------+           PSV cm/sEDV cm/sDescribe        Arm Pressure (mmHG) +----------+--------+--------+----------------+-------------------+ Subclavian117             Multiphasic, WNL                    +----------+--------+--------+----------------+-------------------+ +---------+--------+---+--------+--+---------+ VertebralPSV cm/s114EDV cm/s32Antegrade +---------+--------+---+--------+--+---------+   Summary: Right Carotid: Velocities in the right ICA are consistent with an upper range                60-79% stenosis. Left Carotid: Velocities in the left ICA are consistent with a 1-39% stenosis. Vertebrals:  Bilateral vertebral arteries demonstrate antegrade flow. Subclavians: Normal flow hemodynamics were seen in bilateral subclavian              arteries. *See table(s) above for measurements and observations.  Electronically signed by Harold Barban MD on 11/26/2021 at 9:51:47 PM.    Final       LOS: 1 day    Flora Lipps, MD [facility] Available via Epic secure chat 7am-7pm After these hours, please refer to coverage provider listed on [email or web address] 11/27/2021, 11:37 AM

## 2021-11-27 NOTE — Progress Notes (Addendum)
     Progress Note    11/27/2021 8:00 AM  Subjective:  no complaints.  No further stroke like symptoms  Vitals:   11/27/21 0411 11/27/21 0756  BP: 132/77 133/85  Pulse: 60 60  Resp: 17 16  Temp: 98.4 F (36.9 C) 97.7 F (36.5 C)  SpO2: 100% 99%   Physical Exam: Lungs:  non labored Extremities:  moving all extremities well Neurologic: L forearm numbness  CBC    Component Value Date/Time   WBC 9.6 11/27/2021 0142   RBC 4.79 11/27/2021 0142   HGB 14.7 11/27/2021 0142   HCT 42.7 11/27/2021 0142   PLT 286 11/27/2021 0142   MCV 89.1 11/27/2021 0142   MCH 30.7 11/27/2021 0142   MCHC 34.4 11/27/2021 0142   RDW 12.3 11/27/2021 0142   LYMPHSABS 2.5 11/24/2021 1834   MONOABS 0.6 11/24/2021 1834   EOSABS 0.6 (H) 11/24/2021 1834   BASOSABS 0.1 11/24/2021 1834    BMET    Component Value Date/Time   NA 138 11/27/2021 0142   K 3.9 11/27/2021 0142   CL 103 11/27/2021 0142   CO2 26 11/27/2021 0142   GLUCOSE 127 (H) 11/27/2021 0142   BUN 15 11/27/2021 0142   CREATININE 0.86 11/27/2021 0142   CALCIUM 9.2 11/27/2021 0142   GFRNONAA >60 11/27/2021 0142   GFRAA 32 (L) 10/17/2017 1540    INR    Component Value Date/Time   INR 1.1 11/24/2021 1834    No intake or output data in the 24 hours ending 11/27/21 0800   Assessment/Plan:  66 y.o. male with symptomatic R carotid stenosis  Subjectively, neuro exam remains at baseline Plan is for R TCAR with Dr. Donzetta Matters tomorrow 7/14 Case was discussed with the patient including risks and he agrees to proceed NPO past midnight; consent ordered   Dagoberto Ligas, PA-C [facility] [phone] 11/27/2021 8:00 AM  I have independently interviewed and examined patient and agree with PA assessment and plan above. Plan for R TCAR tomorrow. Continue aspirin, plavix and statin.   Juanangel Soderholm C. Donzetta Matters, MD [facility] [place] Office: [phone] Pager: [phone]

## 2021-11-28 ENCOUNTER — Encounter (HOSPITAL_COMMUNITY): Payer: Self-pay | Admitting: Internal Medicine

## 2021-11-28 ENCOUNTER — Inpatient Hospital Stay (HOSPITAL_COMMUNITY): Payer: Medicare Other | Admitting: Anesthesiology

## 2021-11-28 ENCOUNTER — Encounter (HOSPITAL_COMMUNITY)
Admission: EM | Disposition: A | Discharge status: Home / Self Care | Payer: Self-pay | Source: Home / Self Care | Attending: Internal Medicine

## 2021-11-28 ENCOUNTER — Inpatient Hospital Stay (HOSPITAL_COMMUNITY): Payer: Medicare Other

## 2021-11-28 ENCOUNTER — Other Ambulatory Visit: Payer: Self-pay

## 2021-11-28 DIAGNOSIS — I739 Peripheral vascular disease, unspecified: Secondary | ICD-10-CM

## 2021-11-28 DIAGNOSIS — I63231 Cerebral infarction due to unspecified occlusion or stenosis of right carotid arteries: Secondary | ICD-10-CM

## 2021-11-28 DIAGNOSIS — F17210 Nicotine dependence, cigarettes, uncomplicated: Secondary | ICD-10-CM

## 2021-11-28 DIAGNOSIS — I10 Essential (primary) hypertension: Secondary | ICD-10-CM

## 2021-11-28 HISTORY — PX: TRANSCAROTID ARTERY REVASCULARIZATIONÂ: SHX6778

## 2021-11-28 LAB — ABO/RH: ABO/RH(D): A POS

## 2021-11-28 LAB — SURGICAL PCR SCREEN
MRSA, PCR: NEGATIVE
Staphylococcus aureus: POSITIVE — AB

## 2021-11-28 LAB — TYPE AND SCREEN
ABO/RH(D): A POS
Antibody Screen: NEGATIVE

## 2021-11-28 LAB — POCT ACTIVATED CLOTTING TIME: Activated Clotting Time: 311 seconds

## 2021-11-28 SURGERY — TRANSCAROTID ARTERY REVASCULARIZATION (TCAR)
Anesthesia: General | Site: Neck | Laterality: Right

## 2021-11-28 MED ORDER — PROPOFOL 10 MG/ML IV BOLUS
INTRAVENOUS | Status: DC | PRN
  Administered 2021-11-28: 150 mg via INTRAVENOUS
  Administered 2021-11-28: 50 mg via INTRAVENOUS

## 2021-11-28 MED ORDER — GLYCOPYRROLATE PF 0.2 MG/ML IJ SOSY
PREFILLED_SYRINGE | INTRAMUSCULAR | Status: AC
  Filled 2021-11-28: qty 1

## 2021-11-28 MED ORDER — ROCURONIUM BROMIDE 10 MG/ML (PF) SYRINGE
PREFILLED_SYRINGE | INTRAVENOUS | Status: AC
  Filled 2021-11-28: qty 10

## 2021-11-28 MED ORDER — MUPIROCIN 2 % EX OINT
1.0000 | TOPICAL_OINTMENT | Freq: Two times a day (BID) | CUTANEOUS | Status: DC
  Administered 2021-11-28 – 2021-11-29 (×2): 1 via NASAL
  Filled 2021-11-28 (×2): qty 22

## 2021-11-28 MED ORDER — SODIUM CHLORIDE 0.9 % IV SOLN: 500.0000 mL | Freq: Once | INTRAVENOUS | Status: DC | PRN

## 2021-11-28 MED ORDER — ONDANSETRON HCL 4 MG/2ML IJ SOLN
INTRAMUSCULAR | Status: AC
  Filled 2021-11-28: qty 2

## 2021-11-28 MED ORDER — PHENOL 1.4 % MT LIQD: 1.0000 | OROMUCOSAL | Status: DC | PRN

## 2021-11-28 MED ORDER — PROPOFOL 10 MG/ML IV BOLUS
INTRAVENOUS | Status: AC
  Filled 2021-11-28: qty 20

## 2021-11-28 MED ORDER — GLYCOPYRROLATE PF 0.2 MG/ML IJ SOSY
PREFILLED_SYRINGE | INTRAMUSCULAR | Status: DC | PRN
  Administered 2021-11-28 (×2): .2 mg via INTRAVENOUS

## 2021-11-28 MED ORDER — HYDRALAZINE HCL 20 MG/ML IJ SOLN: 5.0000 mg | INTRAMUSCULAR | Status: DC | PRN

## 2021-11-28 MED ORDER — SUGAMMADEX SODIUM 200 MG/2ML IV SOLN
INTRAVENOUS | Status: DC | PRN
  Administered 2021-11-28: 200 mg via INTRAVENOUS

## 2021-11-28 MED ORDER — HYDROMORPHONE HCL 1 MG/ML IJ SOLN
INTRAMUSCULAR | Status: AC
  Administered 2021-11-28: 0.5 mg via INTRAVENOUS
  Filled 2021-11-28: qty 1

## 2021-11-28 MED ORDER — LACTATED RINGERS IV SOLN: INTRAVENOUS | Status: DC | PRN

## 2021-11-28 MED ORDER — ONDANSETRON HCL 4 MG/2ML IJ SOLN: 4.0000 mg | Freq: Four times a day (QID) | INTRAMUSCULAR | Status: DC | PRN

## 2021-11-28 MED ORDER — HEPARIN 6000 UNIT IRRIGATION SOLUTION
Status: AC
  Filled 2021-11-28: qty 500

## 2021-11-28 MED ORDER — ONDANSETRON HCL 4 MG/2ML IJ SOLN: 4.0000 mg | Freq: Once | INTRAMUSCULAR | Status: DC | PRN

## 2021-11-28 MED ORDER — DOCUSATE SODIUM 100 MG PO CAPS
100.0000 mg | ORAL_CAPSULE | Freq: Every day | ORAL | Status: DC
Start: 2021-11-29 — End: 2021-11-29
  Administered 2021-11-29: 100 mg via ORAL
  Filled 2021-11-28: qty 1

## 2021-11-28 MED ORDER — OXYCODONE HCL 5 MG PO TABS
5.0000 mg | ORAL_TABLET | Freq: Once | ORAL | Status: AC | PRN
  Administered 2021-11-28: 5 mg via ORAL

## 2021-11-28 MED ORDER — PHENYLEPHRINE 80 MCG/ML (10ML) SYRINGE FOR IV PUSH (FOR BLOOD PRESSURE SUPPORT)
PREFILLED_SYRINGE | INTRAVENOUS | Status: DC | PRN
  Administered 2021-11-28: 80 ug via INTRAVENOUS

## 2021-11-28 MED ORDER — GLYCOPYRROLATE PF 0.2 MG/ML IJ SOSY
PREFILLED_SYRINGE | INTRAMUSCULAR | Status: AC
Start: 2021-11-28 — End: ?
  Filled 2021-11-28: qty 1

## 2021-11-28 MED ORDER — CEFAZOLIN SODIUM-DEXTROSE 2-4 GM/100ML-% IV SOLN
2.0000 g | Freq: Three times a day (TID) | INTRAVENOUS | Status: AC
  Administered 2021-11-28 – 2021-11-29 (×2): 2 g via INTRAVENOUS
  Filled 2021-11-28 (×2): qty 100

## 2021-11-28 MED ORDER — OXYCODONE HCL 5 MG/5ML PO SOLN: 5.0000 mg | Freq: Once | ORAL | Status: AC | PRN

## 2021-11-28 MED ORDER — CHLORHEXIDINE GLUCONATE 0.12 % MT SOLN
OROMUCOSAL | Status: AC
  Administered 2021-11-28: 15 mL via OROMUCOSAL
  Filled 2021-11-28: qty 15

## 2021-11-28 MED ORDER — ONDANSETRON HCL 4 MG/2ML IJ SOLN
INTRAMUSCULAR | Status: DC | PRN
  Administered 2021-11-28: 4 mg via INTRAVENOUS

## 2021-11-28 MED ORDER — 0.9 % SODIUM CHLORIDE (POUR BTL) OPTIME
TOPICAL | Status: DC | PRN
  Administered 2021-11-28: 1000 mL

## 2021-11-28 MED ORDER — GUAIFENESIN-DM 100-10 MG/5ML PO SYRP: 15.0000 mL | ORAL_SOLUTION | ORAL | Status: DC | PRN

## 2021-11-28 MED ORDER — METOPROLOL TARTRATE 5 MG/5ML IV SOLN: 2.0000 mg | INTRAVENOUS | Status: DC | PRN

## 2021-11-28 MED ORDER — LIDOCAINE 2% (20 MG/ML) 5 ML SYRINGE
INTRAMUSCULAR | Status: AC
  Filled 2021-11-28: qty 5

## 2021-11-28 MED ORDER — DEXMEDETOMIDINE (PRECEDEX) IN NS 20 MCG/5ML (4 MCG/ML) IV SYRINGE
PREFILLED_SYRINGE | INTRAVENOUS | Status: DC | PRN
  Administered 2021-11-28: 20 ug via INTRAVENOUS

## 2021-11-28 MED ORDER — HYDRALAZINE HCL 20 MG/ML IJ SOLN
INTRAMUSCULAR | Status: AC
  Administered 2021-11-28: 5 mg via INTRAVENOUS
  Filled 2021-11-28: qty 1

## 2021-11-28 MED ORDER — MAGNESIUM SULFATE 2 GM/50ML IV SOLN: 2.0000 g | Freq: Every day | INTRAVENOUS | Status: DC | PRN

## 2021-11-28 MED ORDER — HEPARIN SODIUM (PORCINE) 1000 UNIT/ML IJ SOLN
INTRAMUSCULAR | Status: DC | PRN
  Administered 2021-11-28: 8000 [IU] via INTRAVENOUS

## 2021-11-28 MED ORDER — CEFAZOLIN SODIUM-DEXTROSE 2-3 GM-%(50ML) IV SOLR
INTRAVENOUS | Status: DC | PRN
  Administered 2021-11-28: 2 g via INTRAVENOUS

## 2021-11-28 MED ORDER — FENTANYL CITRATE (PF) 250 MCG/5ML IJ SOLN
INTRAMUSCULAR | Status: AC
  Filled 2021-11-28: qty 5

## 2021-11-28 MED ORDER — OXYCODONE-ACETAMINOPHEN 5-325 MG PO TABS
1.0000 | ORAL_TABLET | ORAL | Status: DC | PRN
  Administered 2021-11-28: 2 via ORAL
  Administered 2021-11-28: 1 via ORAL
  Administered 2021-11-29 (×2): 2 via ORAL
  Filled 2021-11-28 (×4): qty 2

## 2021-11-28 MED ORDER — ALUM & MAG HYDROXIDE-SIMETH 200-200-20 MG/5ML PO SUSP: 15.0000 mL | ORAL | Status: DC | PRN

## 2021-11-28 MED ORDER — LACTATED RINGERS IV SOLN: INTRAVENOUS | Status: DC

## 2021-11-28 MED ORDER — LABETALOL HCL 5 MG/ML IV SOLN
INTRAVENOUS | Status: DC | PRN
  Administered 2021-11-28: 5 mg via INTRAVENOUS

## 2021-11-28 MED ORDER — SODIUM CHLORIDE 0.9 % IV SOLN: INTRAVENOUS | Status: DC

## 2021-11-28 MED ORDER — LIDOCAINE 2% (20 MG/ML) 5 ML SYRINGE
INTRAMUSCULAR | Status: DC | PRN
  Administered 2021-11-28: 100 mg via INTRAVENOUS

## 2021-11-28 MED ORDER — FENTANYL CITRATE (PF) 100 MCG/2ML IJ SOLN
INTRAMUSCULAR | Status: DC | PRN
Start: 2021-11-28 — End: 2021-11-28
  Administered 2021-11-28: 100 ug via INTRAVENOUS
  Administered 2021-11-28 (×3): 50 ug via INTRAVENOUS

## 2021-11-28 MED ORDER — HEPARIN 6000 UNIT IRRIGATION SOLUTION
Status: DC | PRN
  Administered 2021-11-28: 1

## 2021-11-28 MED ORDER — POTASSIUM CHLORIDE CRYS ER 20 MEQ PO TBCR: 20.0000 meq | EXTENDED_RELEASE_TABLET | Freq: Every day | ORAL | Status: DC | PRN

## 2021-11-28 MED ORDER — LABETALOL HCL 5 MG/ML IV SOLN: 10.0000 mg | INTRAVENOUS | Status: DC | PRN

## 2021-11-28 MED ORDER — ENOXAPARIN SODIUM 40 MG/0.4ML IJ SOSY
40.0000 mg | PREFILLED_SYRINGE | INTRAMUSCULAR | Status: DC
  Filled 2021-11-28: qty 0.4

## 2021-11-28 MED ORDER — PROTAMINE SULFATE 10 MG/ML IV SOLN
INTRAVENOUS | Status: DC | PRN
  Administered 2021-11-28: 50 mg via INTRAVENOUS

## 2021-11-28 MED ORDER — PANTOPRAZOLE SODIUM 40 MG PO TBEC
40.0000 mg | DELAYED_RELEASE_TABLET | Freq: Every day | ORAL | Status: DC
  Administered 2021-11-28 – 2021-11-29 (×2): 40 mg via ORAL
  Filled 2021-11-28 (×2): qty 1

## 2021-11-28 MED ORDER — HYDROMORPHONE HCL 1 MG/ML IJ SOLN
0.2500 mg | INTRAMUSCULAR | Status: DC | PRN
  Administered 2021-11-28: 0.5 mg via INTRAVENOUS

## 2021-11-28 MED ORDER — DEXAMETHASONE SODIUM PHOSPHATE 10 MG/ML IJ SOLN
INTRAMUSCULAR | Status: DC | PRN
  Administered 2021-11-28: 5 mg via INTRAVENOUS

## 2021-11-28 MED ORDER — ORAL CARE MOUTH RINSE: 15.0000 mL | Freq: Once | OROMUCOSAL | Status: AC

## 2021-11-28 MED ORDER — HEMOSTATIC AGENTS (NO CHARGE) OPTIME
TOPICAL | Status: DC | PRN
  Administered 2021-11-28: 1 via TOPICAL

## 2021-11-28 MED ORDER — PHENYLEPHRINE 80 MCG/ML (10ML) SYRINGE FOR IV PUSH (FOR BLOOD PRESSURE SUPPORT)
PREFILLED_SYRINGE | INTRAVENOUS | Status: AC
Start: 2021-11-28 — End: ?
  Filled 2021-11-28: qty 10

## 2021-11-28 MED ORDER — CHLORHEXIDINE GLUCONATE 0.12 % MT SOLN: 15.0000 mL | Freq: Once | OROMUCOSAL | Status: AC

## 2021-11-28 MED ORDER — SUCCINYLCHOLINE CHLORIDE 200 MG/10ML IV SOSY
PREFILLED_SYRINGE | INTRAVENOUS | Status: DC | PRN
  Administered 2021-11-28: 150 mg via INTRAVENOUS

## 2021-11-28 MED ORDER — IODIXANOL 320 MG/ML IV SOLN
INTRAVENOUS | Status: DC | PRN
  Administered 2021-11-28: 15 mL

## 2021-11-28 MED ORDER — LIDOCAINE HCL (PF) 1 % IJ SOLN
INTRAMUSCULAR | Status: AC
  Filled 2021-11-28: qty 5

## 2021-11-28 MED ORDER — CHLORHEXIDINE GLUCONATE CLOTH 2 % EX PADS
6.0000 | MEDICATED_PAD | Freq: Every day | CUTANEOUS | Status: DC
  Administered 2021-11-28 – 2021-11-29 (×2): 6 via TOPICAL

## 2021-11-28 MED ORDER — DEXAMETHASONE SODIUM PHOSPHATE 10 MG/ML IJ SOLN
INTRAMUSCULAR | Status: AC
Start: 2021-11-28 — End: ?
  Filled 2021-11-28: qty 1

## 2021-11-28 MED ORDER — MORPHINE SULFATE (PF) 2 MG/ML IV SOLN: 2.0000 mg | INTRAVENOUS | Status: DC | PRN

## 2021-11-28 MED ORDER — ACETAMINOPHEN 10 MG/ML IV SOLN: 1000.0000 mg | Freq: Once | INTRAVENOUS | Status: DC | PRN

## 2021-11-28 MED ORDER — PHENYLEPHRINE HCL-NACL 20-0.9 MG/250ML-% IV SOLN
INTRAVENOUS | Status: DC | PRN
  Administered 2021-11-28: 25 ug/min via INTRAVENOUS

## 2021-11-28 MED ORDER — ROCURONIUM BROMIDE 10 MG/ML (PF) SYRINGE
PREFILLED_SYRINGE | INTRAVENOUS | Status: DC | PRN
  Administered 2021-11-28: 50 mg via INTRAVENOUS

## 2021-11-28 MED ORDER — OXYCODONE HCL 5 MG PO TABS
ORAL_TABLET | ORAL | Status: AC
  Filled 2021-11-28: qty 1

## 2021-11-28 SURGICAL SUPPLY — 60 items
BAG BANDED W/RUBBER/TAPE 36X54 (MISCELLANEOUS) ×2 IMPLANT
BAG COUNTER SPONGE SURGICOUNT (BAG) ×2 IMPLANT
BALLN STERLING RX 5X30X80 (BALLOONS) ×2
BALLOON STERLING RX 5X30X80 (BALLOONS) IMPLANT
CANISTER SUCT 3000ML PPV (MISCELLANEOUS) ×2 IMPLANT
CATH ROBINSON RED A/P 18FR (CATHETERS) IMPLANT
CLIP LIGATING EXTRA MED SLVR (CLIP) ×2 IMPLANT
CLIP LIGATING EXTRA SM BLUE (MISCELLANEOUS) ×2 IMPLANT
COVER DOME SNAP 22 D (MISCELLANEOUS) ×2 IMPLANT
COVER PROBE W GEL 5X96 (DRAPES) ×3 IMPLANT
DERMABOND ADVANCED (GAUZE/BANDAGES/DRESSINGS) ×2
DERMABOND ADVANCED .7 DNX12 (GAUZE/BANDAGES/DRESSINGS) ×1 IMPLANT
DRAPE FEMORAL ANGIO 80X135IN (DRAPES) ×2 IMPLANT
ELECT REM PT RETURN 9FT ADLT (ELECTROSURGICAL) ×2
ELECTRODE REM PT RTRN 9FT ADLT (ELECTROSURGICAL) ×1 IMPLANT
GLOVE BIO SURGEON STRL SZ7.5 (GLOVE) ×2 IMPLANT
GOWN STRL REUS W/ TWL LRG LVL3 (GOWN DISPOSABLE) ×2 IMPLANT
GOWN STRL REUS W/ TWL XL LVL3 (GOWN DISPOSABLE) ×1 IMPLANT
GOWN STRL REUS W/TWL LRG LVL3 (GOWN DISPOSABLE) ×4
GOWN STRL REUS W/TWL XL LVL3 (GOWN DISPOSABLE) ×2
GUIDEWIRE ENROUTE 0.014 (WIRE) ×2 IMPLANT
HEMOSTAT SNOW SURGICEL 2X4 (HEMOSTASIS) IMPLANT
INSERT FOGARTY SM (MISCELLANEOUS) IMPLANT
INTRODUCER KIT GALT 7CM (INTRODUCER) ×2
KIT BASIN OR (CUSTOM PROCEDURE TRAY) ×2 IMPLANT
KIT ENCORE 26 ADVANTAGE (KITS) ×2 IMPLANT
KIT INTRODUCER GALT 7 (INTRODUCER) ×1 IMPLANT
KIT TURNOVER KIT B (KITS) ×2 IMPLANT
NDL HYPO 25GX1X1/2 BEV (NEEDLE) IMPLANT
NEEDLE HYPO 25GX1X1/2 BEV (NEEDLE) IMPLANT
PACK CAROTID (CUSTOM PROCEDURE TRAY) ×2 IMPLANT
POSITIONER HEAD DONUT 9IN (MISCELLANEOUS) ×2 IMPLANT
POWDER SURGICEL 3.0 GRAM (HEMOSTASIS) ×1 IMPLANT
PROTECTION STATION PRESSURIZED (MISCELLANEOUS)
SET MICROPUNCTURE 5F STIFF (MISCELLANEOUS) IMPLANT
SHEATH AVANTI 11CM 5FR (SHEATH) IMPLANT
SHUNT CAROTID BYPASS 10 (VASCULAR PRODUCTS) IMPLANT
SHUNT CAROTID BYPASS 12FRX15.5 (VASCULAR PRODUCTS) IMPLANT
STATION PROTECTION PRESSURIZED (MISCELLANEOUS) IMPLANT
STENT TRANSCAROTID SYSTEM 9X40 (Permanent Stent) ×1 IMPLANT
SUT MNCRL AB 4-0 PS2 18 (SUTURE) ×2 IMPLANT
SUT PROLENE 5 0 C 1 24 (SUTURE) ×2 IMPLANT
SUT PROLENE 6 0 BV (SUTURE) IMPLANT
SUT PROLENE 7 0 BV 1 (SUTURE) IMPLANT
SUT SILK 2 0 PERMA HAND 18 BK (SUTURE) ×2 IMPLANT
SUT SILK 2 0 SH CR/8 (SUTURE) ×2 IMPLANT
SUT SILK 3 0 (SUTURE)
SUT SILK 3-0 18XBRD TIE 12 (SUTURE) IMPLANT
SUT VIC AB 3-0 SH 27 (SUTURE) ×2
SUT VIC AB 3-0 SH 27X BRD (SUTURE) ×1 IMPLANT
SYR 10ML LL (SYRINGE) ×6 IMPLANT
SYR 20ML LL LF (SYRINGE) ×2 IMPLANT
SYR CONTROL 10ML LL (SYRINGE) IMPLANT
SYSTEM TRANSCAROTID NEUROPRTCT (MISCELLANEOUS) ×1 IMPLANT
TOWEL GREEN STERILE (TOWEL DISPOSABLE) ×2 IMPLANT
TRANSCAROTID NEUROPROTECT SYS (MISCELLANEOUS) ×2
TUBING ART PRESS 48 MALE/FEM (TUBING) IMPLANT
TUBING EXTENTION W/L.L. (IV SETS) IMPLANT
WATER STERILE IRR 1000ML POUR (IV SOLUTION) ×2 IMPLANT
WIRE BENTSON .035X145CM (WIRE) ×2 IMPLANT

## 2021-11-28 NOTE — Progress Notes (Signed)
PROGRESS NOTE    Jay Kelly  IPJ:[id number] DOB: Feb 03, 1956 DOA: 11/24/2021 PCP: Trey Sailors, PA    Brief Narrative:  Jay Kelly is a 66 y.o. male with medical history significant of HTN hyperlipidemia and chronic smoking not compliant to his medication presented to the hospital with left-sided numbness.  Patient was noted to have acute ischemic stroke and was admitted hospital for further evaluation and treatment.  During work-up patient was noted to have significant right ICA stenosis and vascular surgery was consulted.  At this time, patient has undergone TCAR on 11/28/2021.    Assessment and Plan:  Principal Problem:   Acute ischemic stroke ([place]) Active Problems:   HTN (hypertension)   HLD (hyperlipidemia)   Smoking   Noncompliance with medication regimen   Carotid stenosis, symptomatic, with infarction ([place])   * Acute ischemic stroke ([place]) CT head was unremarkable.  CTA head and neck showed significant stenosis of the right ICA bulb at the 65 to 70%.  MRI of the brain showed acute to early subacute ischemic infarct in the right parieto-occipital region.  2D echocardiogram with EF of 60 to 65%.  Carotid with ultrasound showed right ICA stenosis at 60 to 79%.  LDL at 128 and hemoglobin A1c at 5.6.  And was continued on aspirin and Plavix.  Vascular surgery was consulted for significant carotid stenosis and patient underwent TCAR on 11/28/21.  Further management as per vascular surgery.  HTN (hypertension) Patient was on permissive hypertension.  Lisinopril on hold.    HLD (hyperlipidemia) LDL at 128.  Continue Crestor  Smoking, tobacco abuse Continue nicotine patch   Noncompliance with medication regimen    DVT prophylaxis: enoxaparin (LOVENOX) injection 40 mg Start: 11/25/21 1000   Code Status:     Code Status: Full Code  Disposition: Home  Status is: Inpatient  The patient is inpatient because: Status post TCAR on November 28, 2021.   Family  Communication: None at bedside  Consultants:  Vascular surgery Neurology  Procedures:  TCAR 11/28/2021  Antimicrobials:  None  Anti-infectives (From admission, onward)    None      Subjective: Today, patient was seen and examined at bedside in PACU..   Objective: Vitals:   11/28/21 1300 11/28/21 1310 11/28/21 1315 11/28/21 1330  BP: (!) 167/97  (!) 142/84 (!) 158/86  Pulse: 66 66 62 66  Resp: '14 16 13 15  '$ Temp:   (!) 97 F (36.1 C)   TempSrc:      SpO2: 98% 99% 97% 98%  Weight:      Height:        Intake/Output Summary (Last 24 hours) at 11/28/2021 1434 Last data filed at 11/28/2021 1206 Gross per 24 hour  Intake 1200 ml  Output 20 ml  Net 1180 ml    Filed Weights   11/24/21 1739 11/28/21 0842  Weight: 68 kg 68 kg    Physical Examination: Body mass index is 22.79 kg/m.   General:  Average built, not in obvious distress HENT:   No scleral pallor or icterus noted. Oral mucosa is moist.   Chest:  Clear breath sounds.  Diminished breath sounds bilaterally. No crackles or wheezes.  CVS: S1 &S2 heard. No murmur.  Regular rate and rhythm. Abdomen: Soft, nontender, nondistended.  Bowel sounds are heard.   Extremities: No cyanosis, clubbing or edema.  Peripheral pulses are palpable. Psych: Alert, awake and oriented, normal mood CNS:  No cranial nerve deficits.  Power equal in all extremities.  Skin: Warm and dry.  No rashes noted.  ??General:  Average built, not in obvious distress HENT:   No scleral pallor or icterus noted. Oral mucosa is moist.  Chest:  Clear breath sounds.  Diminished breath sounds bilaterally. No crackles or wheezes.  CVS: S1 &S2 heard. No murmur.  Regular rate and rhythm. Abdomen: Soft, nontender, nondistended.  Bowel sounds are heard.   Extremities: No cyanosis, clubbing or edema.  Peripheral pulses are palpable. Psych: Alert, awake and oriented, normal mood CNS: Right facial deviation with ectropion chronic.  Reduced sensation left  arm . Skin: Warm and dry.  No rashes noted.  Data Reviewed:   CBC: Recent Labs  Lab 11/24/21 1834 11/27/21 0142  WBC 9.4 9.6  NEUTROABS 5.6  --   HGB 14.8 14.7  HCT 43.8 42.7  MCV 91.8 89.1  PLT 276 286     Basic Metabolic Panel: Recent Labs  Lab 11/24/21 1834 11/27/21 0142  NA 137 138  K 4.2 3.9  CL 105 103  CO2 23 26  GLUCOSE 126* 127*  BUN 14 15  CREATININE 1.03 0.86  CALCIUM 9.3 9.2  MG  --  2.2     Liver Function Tests: Recent Labs  Lab 11/24/21 1834  AST 18  ALT 22  ALKPHOS 65  BILITOT 0.4  PROT 6.7  ALBUMIN 3.6      Radiology Studies: Structural Heart Procedure  Result Date: 11/28/2021 See surgical note for result.  HYBRID OR IMAGING ([facility] ONLY)  Result Date: 11/28/2021 There is no interpretation for this exam.  This order is for images obtained during a surgical procedure.  Please See "Surgeries" Tab for more information regarding the procedure.      LOS: 2 days    Flora Lipps, MD [facility] Available via Epic secure chat 7am-7pm After these hours, please refer to coverage provider listed on [email or web address] 11/28/2021, 2:34 PM

## 2021-11-28 NOTE — Anesthesia Procedure Notes (Signed)
Arterial Line Insertion Start/End7/14/2023 9:30 AM, 11/28/2021 9:35 AM Performed by: CRNA  Patient location: Pre-op. Preanesthetic checklist: patient identified, IV checked, site marked, risks and benefits discussed, surgical consent, monitors and equipment checked, pre-op evaluation, timeout performed and anesthesia consent Lidocaine 1% used for infiltration Left, radial was placed Catheter size: 20 G Hand hygiene performed  and maximum sterile barriers used   Attempts: 2 Procedure performed without using ultrasound guided technique. Following insertion, dressing applied and Biopatch. Post procedure assessment: normal and unchanged

## 2021-11-28 NOTE — Anesthesia Postprocedure Evaluation (Signed)
Anesthesia Post Note  Patient: Jay Kelly  Procedure(s) Performed: Right Transcarotid Artery Revascularization (Right: Neck)     Patient location during evaluation: PACU Anesthesia Type: General Level of consciousness: awake and alert Pain management: pain level controlled Vital Signs Assessment: post-procedure vital signs reviewed and stable Respiratory status: spontaneous breathing, nonlabored ventilation, respiratory function stable and patient connected to nasal cannula oxygen Cardiovascular status: blood pressure returned to baseline and stable Postop Assessment: no apparent nausea or vomiting Anesthetic complications: yes   Encounter Notable Events  Notable Event Outcome Phase Comment  Difficult to intubate - expected  Intraprocedure Filed from anesthesia note documentation.    Last Vitals:  Vitals:   11/28/21 1300 11/28/21 1310  BP: (!) 167/97   Pulse: 66 66  Resp: 14 16  Temp:    SpO2: 98% 99%    Last Pain:  Vitals:   11/28/21 1315  TempSrc:   PainSc: Asleep                 Tadashi Burkel S

## 2021-11-28 NOTE — Op Note (Signed)
Patient name: Jay Kelly MRN: [id number] DOB: 1956/02/14 Sex: male  11/28/2021 Pre-operative Diagnosis: symptomatic right ica stenosis Post-operative diagnosis:  Same Surgeon:  Erlene Quan C. Donzetta Matters, MD Assistant: Leontine Locket, PA Procedure Performed: 1.   Ultrasound-guided cannulation left common femoral vein for placement of 8 French venous return sheath 2.  Transcatheter placement of right ICA 9 x 40 mm EnRoute stent with 5 x 30 mm predilatation and flow reversal neuro protection  Indications: 66 year old male with history of right facial surgery with recent right-sided stroke with left arm numbness and weakness and changes to his left-sided vision.  He is now indicated for right sided transcarotid artery stenting.  Findings: There was a very high-grade stenosis of the right ICA just after the bifurcation.  After stenting this was reduced to 0%.   Procedure:  The patient was identified in the holding area and taken to the operating room where is placed supine on upper table and sterilely prepped and draped in the right neck and chest and bilateral groins in usual fashion, antibiotics were minister timeout was called.  We began using ultrasound guidance to cannulate the left common femoral vein with micropuncture needle followed by wire and sheath.  A Bentson wire was placed followed by 8 French venous return sheath.  This was affixed to the skin with silk suture and flushed with heparinized saline.  Ultrasound was used identify the common carotid artery between 200 and sternocleidomastoid vertical incision was created we dissected down through the 2 heads of the mastoid and retracted the IJ laterally.  Identified the common carotid artery patient was fully heparinized and ACT returned greater than 300.  We placed a 5-0 Prolene U stitch at the planned cannulation zone.  We then cannulated the common carotid artery with a micropuncture needle followed by a wire to 4 cm and the sheath at 3 cm.  We  then performed angiography and placed the Amplatz wire to the level of the carotid bifurcation.  We then placed the arterial 8 French sheath under fluoroscopic guidance this was affixed to the skin in 2 locations.  We then initiated flow reversal.  After we confirmed blood pressure with systolic of 382 and ACT greater than 300 we then clamped the common carotid artery with vessel loop and a Potts configuration.  We crossed the stenosed area of the common common and internal carotid arteries and perform primary balloon angioplasty with a 5 x 30 stent and primarily stented with a 9 x 40 self-expanding stent.  We then allow washout for 2 minutes.  We performed a completion angiography in 2 views.  We then remove the wire the clamp was removed and the blood was returned via the venous sheath.  Doppler confirmed adequate flow in the common carotid artery with flow throughout diastole.  Satisfied with this we remove this sheath and cinched our 5-0 Prolene suture.  50 mg of protamine was administered.  Sheath was removed and the left groin and pressure held for hemostasis obtained.  We obtain hemostasis in the right neck wound and irrigated the wound and then closed in layers with Vicryl and Monocryl.  Dermabond was placed at the skin level.  He was then awakened from anesthesia having tolerated procedure well without any complication.  All counts were correct at completion.  An experienced assistant was necessary to expedite the case specifically with help passing the wire and balloon and stent as well as with traction and countertraction with exposure of the common carotid  artery.  EBL: 50cc  Contrast: 12cc  Alaura Schippers C. Donzetta Matters, MD [facility] Office: [phone] Pager: [phone]

## 2021-11-28 NOTE — Anesthesia Preprocedure Evaluation (Addendum)
Anesthesia Evaluation  Patient identified by MRN, date of birth, ID band Patient awake    Reviewed: Allergy & Precautions, NPO status , Patient's Chart, lab work & pertinent test results  Airway Mallampati: IV  TM Distance: >3 FB Neck ROM: Full  Mouth opening: Limited Mouth Opening Comment: Extremely limited mouth opening Will induce with short acting agents, if unable to mask will place LMA and wake patient up for AFOI Dental  (+) Missing, Poor Dentition, Chipped, Dental Advisory Given   Pulmonary Current Smoker and Patient abstained from smoking.,    Pulmonary exam normal breath sounds clear to auscultation       Cardiovascular hypertension, + Peripheral Vascular Disease  Normal cardiovascular exam Rhythm:Regular Rate:Normal  1. Left ventricular ejection fraction, by estimation, is 60 to 65%. The  left ventricle has normal function. The left ventricle has no regional  wall motion abnormalities. Left ventricular diastolic parameters were  normal.  2. Right ventricular systolic function is normal. The right ventricular  size is normal.  3. The mitral valve is normal in structure. Trivial mitral valve  regurgitation. No evidence of mitral stenosis.  4. The aortic valve is tricuspid. Aortic valve regurgitation is not  visualized. No aortic stenosis is present.  5. The inferior vena cava is normal in size with greater than 50%  respiratory variability, suggesting right atrial pressure of 3 mmHg.    Neuro/Psych CVA negative psych ROS   GI/Hepatic negative GI ROS, Neg liver ROS,   Endo/Other  negative endocrine ROS  Renal/GU negative Renal ROS  negative genitourinary   Musculoskeletal negative musculoskeletal ROS (+)   Abdominal   Peds negative pediatric ROS (+)  Hematology negative hematology ROS (+)   Anesthesia Other Findings   Reproductive/Obstetrics negative OB ROS                             Anesthesia Physical Anesthesia Plan  ASA: 3  Anesthesia Plan: General   Post-op Pain Management: Minimal or no pain anticipated   Induction: Intravenous  PONV Risk Score and Plan: 1 and Ondansetron, Dexamethasone and Treatment may vary due to age or medical condition  Airway Management Planned: Oral ETT and Video Laryngoscope Planned  Additional Equipment: Arterial line  Intra-op Plan:   Post-operative Plan: Extubation in OR  Informed Consent: I have reviewed the patients History and Physical, chart, labs and discussed the procedure including the risks, benefits and alternatives for the proposed anesthesia with the patient or authorized representative who has indicated his/her understanding and acceptance.     Dental advisory given  Plan Discussed with: CRNA and Surgeon  Anesthesia Plan Comments: (Used glidescope for intubation. Very difficult to get mouth open to the point where you could insert laryngoscope blade Would consider AFOI for any future procedures)      Anesthesia Quick Evaluation

## 2021-11-28 NOTE — Progress Notes (Signed)
OT Cancellation Note  Patient Details Name: Jay Kelly MRN: [id number] DOB: June 29, 1955   Cancelled Treatment:    Reason Eval/Treat Not Completed: Patient at procedure or test/ unavailable (Off the floor at sx. Will return as schedule allows.)  [place], OTR/L Acute Rehab Office: [phone] 11/28/2021, 9:22 AM

## 2021-11-28 NOTE — Discharge Instructions (Signed)
   [facility]  Discharge Instructions   Carotid Surgery  Please refer to the following instructions for your post-procedure care. Your surgeon or physician assistant will discuss any changes with you.  Activity  You are encouraged to walk as much as you can. You can slowly return to normal activities but must avoid strenuous activity and heavy lifting until your doctor tell you it's okay. Avoid activities such as vacuuming or swinging a golf club. You can drive after one week if you are comfortable and you are no longer taking prescription pain medications. It is normal to feel tired for serval weeks after your surgery. It is also normal to have difficulty with sleep habits, eating, and bowel movements after surgery. These will go away with time.  Bathing/Showering  Shower daily after you go home. Do not soak in a bathtub, hot tub, or swim until the incision heals completely.  Incision Care  Shower every day. Clean your incision with mild soap and water. Pat the area dry with a clean towel. You do not need a bandage unless otherwise instructed. Do not apply any ointments or creams to your incision. You may have skin glue on your incision. Do not peel it off. It will come off on its own in about one week. Your incision may feel thickened and raised for several weeks after your surgery. This is normal and the skin will soften over time.   For Men Only: It's okay to shave around the incision but do not shave the incision itself for 2 weeks. It is common to have numbness under your chin that could last for several months.  Diet  Resume your normal diet. There are no special food restrictions following this procedure. A low fat/low cholesterol diet is recommended for all patients with vascular disease. In order to heal from your surgery, it is CRITICAL to get adequate nutrition. Your body requires vitamins, minerals, and protein. Vegetables are the best source of  vitamins and minerals. Vegetables also provide the perfect balance of protein. Processed food has little nutritional value, so try to avoid this.  Medications  Resume taking all of your medications unless your doctor or physician assistant tells you not to. If your incision is causing pain, you may take over-the- counter pain relievers such as acetaminophen (Tylenol). If you were prescribed a stronger pain medication, please be aware these medications can cause nausea and constipation. Prevent nausea by taking the medication with a snack or meal. Avoid constipation by drinking plenty of fluids and eating foods with a high amount of fiber, such as fruits, vegetables, and grains.   Do not take Tylenol if you are taking prescription pain medications.  Follow Up  Our office will schedule a follow up appointment 2-3 weeks following discharge.  Please call us immediately for any of the following conditions  . Increased pain, redness, drainage (pus) from your incision site. . Fever of 101 degrees or higher. . If you should develop stroke (slurred speech, difficulty swallowing, weakness on one side of your body, loss of vision) you should call 911 and go to the nearest emergency room. .  Reduce your risk of vascular disease:  . Stop smoking. If you would like help call [facility] at 1-800-QUIT-NOW (1-800-784-8669) or [place] at 336-586-4000. . Manage your cholesterol . Maintain a desired weight . Control your diabetes . Keep your blood pressure down .  If you have any questions, please call the office at [phone]. 

## 2021-11-28 NOTE — Progress Notes (Signed)
  Day of Surgery Note    Subjective:  wants something to drink   Vitals:   11/28/21 0842 11/28/21 1215  BP: (!) 163/90   Pulse: 60   Resp: 18   Temp: 98 F (36.7 C) 97.6 F (36.4 C)  SpO2: 98%     Incisions:   right neck and left groin incisions look fine without hematoma Extremities:  hand grips equal bilaterally; moving bilateral feet equally Lungs:  non labored    Assessment/Plan:  This is a 66 y.o. male who is s/p  Right TCAR  -pt doing well in recovery -plan for transfer to [place] later today -continue asa/statin/plavix   Leontine Locket, PA-C 11/28/2021 12:40 PM [phone]

## 2021-11-28 NOTE — Care Management Important Message (Signed)
Important Message  Patient Details  Name: Jay Kelly MRN: [id number] Date of Birth: 1955/08/06   Medicare Important Message Given:  Yes  Patient has a Contact precaution order in Place will mail copy of IM to the patient home address.     Jejuan Scala 11/28/2021, 2:45 PM

## 2021-11-28 NOTE — Progress Notes (Signed)
  Progress Note    11/28/2021 9:27 AM Day of Surgery  Subjective:  no overnight issues  Vitals:   11/28/21 0346 11/28/21 0842  BP: (!) 148/100 (!) 163/90  Pulse: 64 60  Resp: 17 18  Temp: 98.2 F (36.8 C) 98 F (36.7 C)  SpO2: 98% 98%    Physical Exam: Aaox3 Stable left grip strength  CBC    Component Value Date/Time   WBC 9.6 11/27/2021 0142   RBC 4.79 11/27/2021 0142   HGB 14.7 11/27/2021 0142   HCT 42.7 11/27/2021 0142   PLT 286 11/27/2021 0142   MCV 89.1 11/27/2021 0142   MCH 30.7 11/27/2021 0142   MCHC 34.4 11/27/2021 0142   RDW 12.3 11/27/2021 0142   LYMPHSABS 2.5 11/24/2021 1834   MONOABS 0.6 11/24/2021 1834   EOSABS 0.6 (H) 11/24/2021 1834   BASOSABS 0.1 11/24/2021 1834    BMET    Component Value Date/Time   NA 138 11/27/2021 0142   K 3.9 11/27/2021 0142   CL 103 11/27/2021 0142   CO2 26 11/27/2021 0142   GLUCOSE 127 (H) 11/27/2021 0142   BUN 15 11/27/2021 0142   CREATININE 0.86 11/27/2021 0142   CALCIUM 9.2 11/27/2021 0142   GFRNONAA >60 11/27/2021 0142   GFRAA 32 (L) 10/17/2017 1540    INR    Component Value Date/Time   INR 1.1 11/24/2021 1834     Intake/Output Summary (Last 24 hours) at 11/28/2021 0927 Last data filed at 11/27/2021 1300 Gross per 24 hour  Intake 320 ml  Output --  Net 320 ml     Assessment:  66 y.o. male is here with symptomatic right ica stenosis  Plan: OR today for R TCAR   Tashaya Ancrum C. Donzetta Matters, MD [facility] Office: [phone] Pager: [phone]  11/28/2021 9:27 AM

## 2021-11-28 NOTE — Anesthesia Procedure Notes (Signed)
Procedure Name: Intubation Date/Time: 11/28/2021 10:37 AM  Performed by: Barrington Ellison, CRNAPre-anesthesia Checklist: Patient identified, Emergency Drugs available, Suction available and Patient being monitored Patient Re-evaluated:Patient Re-evaluated prior to induction Oxygen Delivery Method: Circle System Utilized Preoxygenation: Pre-oxygenation with 100% oxygen Induction Type: IV induction and Rapid sequence Ventilation: Mask ventilation with difficulty and Two handed mask ventilation required Laryngoscope Size: Glidescope and 4 Grade View: Grade I Tube type: Oral Number of attempts: 1 Airway Equipment and Method: Stylet and Oral airway Placement Confirmation: ETT inserted through vocal cords under direct vision, positive ETCO2 and breath sounds checked- equal and bilateral Secured at: 22 cm Tube secured with: Tape Dental Injury: Teeth and Oropharynx as per pre-operative assessment and Bloody posterior oropharynx  Difficulty Due To: Difficulty was anticipated Future Recommendations: Recommend- induction with short-acting agent, and alternative techniques readily available and Recommend- awake intubation Comments: VERY DIFFICULT intubation. Patient has very limited oral opening. Difficulty inserting glidescope blade. Strongly recommend awake intubation for future cases.

## 2021-11-28 NOTE — Transfer of Care (Signed)
Immediate Anesthesia Transfer of Care Note  Patient: Jay Kelly  Procedure(s) Performed: Right Transcarotid Artery Revascularization (Right: Neck)  Patient Location: PACU  Anesthesia Type:General  Level of Consciousness: awake, alert  and oriented  Airway & Oxygen Therapy: Patient Spontanous Breathing  Post-op Assessment: Report given to RN, Patient moving all extremities X 4 and Patient able to stick tongue midline  Post vital signs: Reviewed and stable  Last Vitals:  Vitals Value Taken Time  BP 168/90 11/28/21 1215  Temp    Pulse 70 11/28/21 1215  Resp 15 11/28/21 1215  SpO2 97 % 11/28/21 1215  Vitals shown include unvalidated device data.  Last Pain:  Vitals:   11/28/21 0854  TempSrc:   PainSc: 0-No pain         Complications:  Encounter Notable Events  Notable Event Outcome Phase Comment  Difficult to intubate - expected  Intraprocedure Filed from anesthesia note documentation.

## 2021-11-29 LAB — BASIC METABOLIC PANEL
Anion gap: 13 (ref 5–15)
BUN: 14 mg/dL (ref 8–23)
CO2: 22 mmol/L (ref 22–32)
Calcium: 9.1 mg/dL (ref 8.9–10.3)
Chloride: 102 mmol/L (ref 98–111)
Creatinine, Ser: 0.84 mg/dL (ref 0.61–1.24)
GFR, Estimated: >60 mL/min (ref >60)
Glucose, Bld: 140 mg/dL — ABNORMAL HIGH (ref 70–99)
Potassium: 4.1 mmol/L (ref 3.5–5.1)
Sodium: 137 mmol/L (ref 135–145)

## 2021-11-29 LAB — CBC
HCT: 39.2 % (ref 39.0–52.0)
Hemoglobin: 13.6 g/dL (ref 13.0–17.0)
MCH: 30.7 pg (ref 26.0–34.0)
MCHC: 34.7 g/dL (ref 30.0–36.0)
MCV: 88.5 fL (ref 80.0–100.0)
Platelets: 307 10*3/uL (ref 150–400)
RBC: 4.43 MIL/uL (ref 4.22–5.81)
RDW: 12.3 % (ref 11.5–15.5)
WBC: 13.2 10*3/uL — ABNORMAL HIGH (ref 4.0–10.5)
nRBC: 0 % (ref 0.0–0.2)

## 2021-11-29 MED ORDER — ROSUVASTATIN CALCIUM 20 MG PO TABS: 20.0000 mg | ORAL_TABLET | Freq: Every day | ORAL | 0 refills | Status: AC

## 2021-11-29 MED ORDER — OXYCODONE-ACETAMINOPHEN 5-325 MG PO TABS: 1.0000 | ORAL_TABLET | Freq: Four times a day (QID) | ORAL | 0 refills | Status: DC | PRN

## 2021-11-29 MED ORDER — PANTOPRAZOLE SODIUM 40 MG PO TBEC: 40.0000 mg | DELAYED_RELEASE_TABLET | Freq: Every day | ORAL | 0 refills | Status: AC

## 2021-11-29 MED ORDER — ASPIRIN 81 MG PO TBEC: 81.0000 mg | DELAYED_RELEASE_TABLET | Freq: Every day | ORAL | 12 refills | Status: AC

## 2021-11-29 MED ORDER — NICOTINE 21 MG/24HR TD PT24: 21.0000 mg | MEDICATED_PATCH | Freq: Every day | TRANSDERMAL | 0 refills | Status: DC

## 2021-11-29 MED ORDER — CLOPIDOGREL BISULFATE 75 MG PO TABS: 75.0000 mg | ORAL_TABLET | Freq: Every day | ORAL | 1 refills | Status: DC

## 2021-11-29 NOTE — Discharge Summary (Signed)
Physician Discharge Summary  Jay Kelly GLO:[id number] DOB: Apr 23, 1956 DOA: 11/24/2021  PCP: Trey Sailors, PA  Admit date: 11/24/2021 Discharge date: 11/29/2021  Admitted From: home Discharge disposition: home   Recommendations for Outpatient Follow-Up:   Statin/asa/plavix Vascular follow up Smoking cessation   Discharge Diagnosis:   Principal Problem:   Acute ischemic stroke [facility]) Active Problems:   HTN (hypertension)   HLD (hyperlipidemia)   Smoking   Noncompliance with medication regimen   Carotid stenosis, symptomatic, with infarction [facility])    Discharge Condition: Improved.  Diet recommendation: Low sodium, heart healthy.  Wound care: None.  Code status: Full.   History of Present Illness:   Jay Kelly is a 66 y.o. male with medical history significant of HTN, HLD, smoking.   Pt admits he doesn't take any of his prescribed meds for HTN/HLD (or any meds for that matter).   Pt presents with L arm numbness that onset on Friday.  Symptoms are persistent.  Pt states "I think I had a stroke on Friday".   Hospital Course by Problem:   Acute ischemic stroke [facility]) CT head was unremarkable.  CTA head and neck showed significant stenosis of the right ICA bulb at the 65 to 70%.  MRI of the brain showed acute to early subacute ischemic infarct in the right parieto-occipital region.  2D echocardiogram with EF of 60 to 65%.  Carotid with ultrasound showed right ICA stenosis at 60 to 79%.  LDL at 128 and hemoglobin A1c at 5.6.   - postoperative day #1, status post right TCAR.  He is neurologically intact.  His incision is healing nicely.  He needs to remain on dual antiplatelet therapy and statin therapy.   HTN (hypertension) -resume home meds   HLD (hyperlipidemia) LDL at 128.  Continue Crestor   Smoking, tobacco abuse Continue nicotine patch    Noncompliance with medication regimen  -says he will be compliant    Medical Consultants:    Neurology vascular   Discharge Exam:   Vitals:   11/29/21 0346 11/29/21 0817  BP: 119/79 (!) 150/88  Pulse: 60 62  Resp: 13 20  Temp: 98 F (36.7 C) 97.9 F (36.6 C)  SpO2:     Vitals:   11/28/21 2008 11/28/21 2341 11/29/21 0346 11/29/21 0817  BP: 122/84 (!) 156/83 119/79 (!) 150/88  Pulse: 67 65 60 62  Resp: '13 19 13 20  '$ Temp: 98.2 F (36.8 C) 98.4 F (36.9 C) 98 F (36.7 C) 97.9 F (36.6 C)  TempSrc: Oral Oral Oral Oral  SpO2:      Weight:      Height:        General exam: Appears calm and comfortable.     The results of significant diagnostics from this hospitalization (including imaging, microbiology, ancillary and laboratory) are listed below for reference.     Procedures and Diagnostic Studies:   CT ANGIO HEAD NECK W WO CM  Addendum Date: 11/25/2021   ADDENDUM REPORT: 11/25/2021 12:06 ADDENDUM: CORRECTION, the hemodynamically significant ICA stenosis in the neck is on the RIGHT side, as described in the Findings section. Impression #2 should read in part: "Significant stenosis at the RIGHT ICA bulb is numerically estimated at 65-70%." Electronically Signed   By: Genevie Ann M.D.   On: 11/25/2021 12:06   Result Date: 11/25/2021 CLINICAL DATA:  66 year old male with left arm weakness. Scattered small infarcts in the right MCA and MCA/PCA watershed on brain MRI today.  EXAM: CT ANGIOGRAPHY HEAD AND NECK TECHNIQUE: Multidetector CT imaging of the head and neck was performed using the standard protocol during bolus administration of intravenous contrast. Multiplanar CT image reconstructions and MIPs were obtained to evaluate the vascular anatomy. Carotid stenosis measurements (when applicable) are obtained utilizing NASCET criteria, using the distal internal carotid diameter as the denominator. RADIATION DOSE REDUCTION: This exam was performed according to the departmental dose-optimization program which includes automated exposure control, adjustment of the mA and/or kV  according to patient size and/or use of iterative reconstruction technique. CONTRAST:  48m OMNIPAQUE IOHEXOL 350 MG/ML SOLN COMPARISON:  Brain MRI 0050 hours today. Head CT yesterday. FINDINGS: CTA NECK Skeleton: Carious dentition. 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 Evidence of previous ORIF at the right zygomaticomaxillary confluence. Paranasal sinus mucoperiosteal thickening. Tympanic cavities are clear. Ordinary cervical spine degeneration. No acute osseous abnormality identified. Upper chest: Multiple 4 mm bilateral upper lobe solid pulmonary nodules (series 5, image 162 on the left and image 174 on the right. Negative visible mediastinum. Other neck: Chronic appearing skin and subcutaneous thickening along the right face. Atrophied or absent right parotid gland. No definite acute neck soft tissue finding. Aortic arch: Calcified aortic atherosclerosis. 3 vessel arch configuration. Right carotid system: Soft and calcified brachiocephalic artery and right CCA origin plaque without stenosis. Severe soft more so than calcified atherosclerosis at the right ICA origin and bulb, with maximal stenosis at the distal bulb numerically estimated at 65-70 % with respect to the distal vessel (series 6, image 308). Right ICA remains patent to the skull base. Left carotid system: Mild plaque at the left CCA origin, no stenosis. Mild to moderate plaque at the left carotid bifurcation. Partially retropharyngeal course of the cervical left ICA but no significant stenosis. Vertebral arteries: Mild proximal right subclavian artery plaque without stenosis. Normal right vertebral artery origin. Tortuous right vertebral artery is patent to the skull base with no significant plaque or stenosis. Proximal left subclavian artery mild to moderate atherosclerosis without stenosis. Normal left vertebral  artery origin. Tortuous and fairly codominant left vertebral artery is patent to the skull base without significant plaque or stenosis. CTA HEAD Posterior circulation: Mildly tortuous distal vertebral arteries are patent to the basilar without stenosis. Normal right PICA origin. Left AICA appears dominant. Patent basilar artery with plaque but no significant stenosis. Patent SCA and PCA origins. Small right posterior communicating artery, the left is diminutive or absent. Left PCA branches are within normal limits. Right PCA branches are also within normal limits. Anterior circulation: Both ICA siphons are patent with moderate calcified plaque. Only mild left siphon stenosis. Similar mild right siphon stenosis. Patent carotid termini, MCA and ACA origins. Left ACA A1 segment is dominant. Right ACA A1 segment is fenestrated. Anterior communicating artery and bilateral ACA branches are within normal limits. Left MCA M1 segment is tortuous and patent without stenosis. Left MCA branches are within normal limits. Right MCA M1 segment is patent to the bifurcation without stenosis. No right MCA branch occlusion is identified. There is mild right MCA branch irregularity. Venous sinuses: Patent. Anatomic variants: Dominant left and fenestrated right ACA A1 segments. Review of the MIP images confirms the above findings IMPRESSION: 1. Negative for large vessel occlusion. 2. Widespread atherosclerosis in the neck and at the skull base. Significant stenosis at the Left ICA bulb is numerically estimated at 65-70%. But no other significant arterial stenosis identified in  the head or neck. Aortic Atherosclerosis (ICD10-I70.0). 3. Multiple 4 mm bilateral upper lobe solid pulmonary nodules. If patient is low risk for malignancy, no routine follow-up imaging is recommended; if patient is high risk for malignancy, a non-contrast Chest CT at 12 months is optional. If performed and the nodule is stable at 12 months, no further follow-up  is recommended. Reference: Radiology. 2017; 284(1):228-43. 4. 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 Prior ORIF at the right zygomaticomaxillary confluence. Carious dentition. Electronically Signed: By: Genevie Ann M.D. On: 11/25/2021 04:19   ECHOCARDIOGRAM COMPLETE  Result Date: 11/25/2021    ECHOCARDIOGRAM REPORT   Patient Name:   Jay Kelly Date of Exam: 11/25/2021 Medical Rec #:  [id number]      Height:       68.0 in Accession #:    [id number]     Weight:       150.0 lb Date of Birth:  1956-02-11      BSA:          1.809 m Patient Age:    22 years       BP:           140/98 mmHg Patient Gender: M              HR:           61 bpm. Exam Location:  Inpatient Procedure: 2D Echo, Cardiac Doppler and Color Doppler Indications:    Stroke I63.9  History:        Patient has no prior history of Echocardiogram examinations.                 Stroke; Risk Factors:Dyslipidemia, Hypertension and Current                 Smoker.  Sonographer:    Ronny Flurry Referring Phys: Agency  1. Left ventricular ejection fraction, by estimation, is 60 to 65%. The left ventricle has normal function. The left ventricle has no regional wall motion abnormalities. Left ventricular diastolic parameters were normal.  2. Right ventricular systolic function is normal. The right ventricular size is normal.  3. The mitral valve is normal in structure. Trivial mitral valve regurgitation. No evidence of mitral stenosis.  4. The aortic valve is tricuspid. Aortic valve regurgitation is not visualized. No aortic stenosis is present.  5. The inferior vena cava is normal in size with greater than 50% respiratory variability, suggesting right atrial pressure of 3 mmHg. FINDINGS  Left Ventricle: Left ventricular ejection fraction, by estimation, is 60 to 65%. The left ventricle has normal function. The left ventricle has no regional wall motion abnormalities. The left ventricular internal cavity size was  normal in size. There is  no left ventricular hypertrophy. Left ventricular diastolic parameters were normal. Normal left ventricular filling pressure. Right Ventricle: The right ventricular size is normal. No increase in right ventricular wall thickness. Right ventricular systolic function is normal. Left Atrium: Left atrial size was normal in size. Right Atrium: Right atrial size was normal in size. Pericardium: There is no evidence of pericardial effusion. Mitral Valve: The mitral valve is normal in structure. Trivial mitral valve regurgitation. No evidence of mitral valve stenosis. Tricuspid Valve: The tricuspid valve is normal in structure. Tricuspid valve regurgitation is not demonstrated. No evidence of tricuspid stenosis. Aortic Valve: The aortic valve is tricuspid. Aortic valve regurgitation is not visualized. No aortic stenosis is present. Pulmonic Valve: The pulmonic valve was normal in structure.  Pulmonic valve regurgitation is not visualized. No evidence of pulmonic stenosis. Aorta: The aortic root is normal in size and structure. Venous: The inferior vena cava is normal in size with greater than 50% respiratory variability, suggesting right atrial pressure of 3 mmHg. IAS/Shunts: No atrial level shunt detected by color flow Doppler.  LEFT VENTRICLE PLAX 2D LVIDd:         3.90 cm   Diastology LVIDs:         2.80 cm   LV e' medial:  6.85 cm/s LV PW:         1.00 cm   LV e' lateral: 9.25 cm/s LV IVS:        0.90 cm LVOT diam:     2.10 cm LVOT Area:     3.46 cm  RIGHT VENTRICLE TAPSE (M-mode): 2.7 cm LEFT ATRIUM           Index       RIGHT ATRIUM           Index LA diam:      3.30 cm 1.82 cm/m  RA Area:     11.40 cm LA Vol (A4C): 15.6 ml 8.63 ml/m  RA Volume:   25.80 ml  14.27 ml/m   AORTA Ao Root diam: 3.20 cm Ao Asc diam:  3.30 cm  SHUNTS Systemic Diam: 2.10 cm Dani Gobble Croitoru MD Electronically signed by Sanda Klein MD Signature Date/Time: 11/25/2021/10:05:05 AM    Final    MR Cervical Spine Wo  Contrast  Result Date: 11/25/2021 CLINICAL DATA:  Initial evaluation for acute myelopathy. EXAM: MRI CERVICAL SPINE WITHOUT CONTRAST TECHNIQUE: Multiplanar, multisequence MR imaging of the cervical spine was performed. No intravenous contrast was administered. COMPARISON:  None Available. FINDINGS: Alignment: Reversal of the normal cervical lordosis. Trace facet mediated anterolisthesis of C3 on C4 and C7 on T1. Vertebrae: Vertebral body height maintained without acute or chronic fracture. Bone marrow signal intensity heterogeneous but overall within normal limits. No worrisome osseous lesions. No abnormal marrow edema. Cord: Patchy signal abnormality seen involving the bilateral hemi cord at C4-5 and C5-6, most consistent with chronic myelomalacia (series 8, images 24, 29). Findings likely due to compressive stenosis at these levels. Visualized cord otherwise within normal limits. Posterior Fossa, vertebral arteries, paraspinal tissues: Craniocervical junction normal. Paraspinous soft tissues within normal limits. Normal flow voids seen within the vertebral arteries bilaterally. Asymmetric prominence of the fatty soft tissues of the visualized right face noted. Disc levels: C2-C3: Negative interspace. Moderate left with mild right facet hypertrophy. No spinal stenosis. Mild left C3 foraminal stenosis. Right neural foramina remains patent. C3-C4: Trace anterolisthesis. Mild diffuse disc bulge, eccentric to the right. Associated endplate and uncovertebral spurring with right-sided facet arthrosis. Resultant mild-to-moderate spinal stenosis. Severe right with mild left C4 foraminal stenosis. C4-C5: Degenerative intervertebral disc space narrowing. Broad-based right paracentral disc osteophyte complex indents and partially faces the ventral thecal sac. Associated superior migration of soft disc material noted (series 8, image 22). Mild ligament flavum hypertrophy. Resultant flattening of the cervical cord with  associated cord signal changes, compatible with chronic myelomalacia. Moderate to severe spinal stenosis with the thecal sac measuring 6-7 mm in AP diameter. Severe left with moderate right C5 foraminal stenosis. C5-C6: Degenerative intervertebral disc space narrowing. Broad base right paracentral disc osteophyte complex indents the right ventral thecal sac. Secondary flattening of the ventral cord. Associated cord signal changes compatible with compressive myelomalacia. Moderate to severe spinal stenosis. Moderate left with severe right C6 foraminal narrowing. C6-C7:  Degenerative intervertebral disc space narrowing. Broad-based central to right paracentral disc osteophyte complex flattens and indents the ventral thecal sac. Mild cord flattening without cord signal changes. Mild to moderate spinal stenosis. Moderate left with mild right C7 foraminal narrowing. C7-T1: Mild disc bulge with uncovertebral spurring, greater on the left. Left greater than right facet hypertrophy. No significant spinal stenosis. Severe left C8 foraminal narrowing. Right neural foramen is patent. Visualized upper thoracic spine demonstrates no significant finding. IMPRESSION: 1. Degenerative spondylosis at C4-5 and C5-6 with resultant moderate to severe spinal stenosis, with severe left C5 and right C6 foraminal narrowing. Patchy signal abnormality involving the cervical spinal cord at these levels consistent with chronic myelomalacia. 2. Degenerative spondylosis at C3-4 and C6-7 with resultant mild to moderate spinal stenosis, with severe right C4 and moderate left C7 foraminal stenosis. 3. Severe left C8 foraminal stenosis related to disc bulging and facet hypertrophy. Electronically Signed   By: Jeannine Boga M.D.   On: 11/25/2021 02:20   MR BRAIN WO CONTRAST  Result Date: 11/25/2021 CLINICAL DATA:  Initial evaluation for neuro deficit, stroke suspected. EXAM: MRI HEAD WITHOUT CONTRAST TECHNIQUE: Multiplanar, multiecho pulse  sequences of the brain and surrounding structures were obtained without intravenous contrast. COMPARISON:  Prior CT from 11/24/2021. FINDINGS: Brain: Cerebral volume within normal limits. Right cerebellar hemisphere is dysplastic in appearance. No significant cerebral white matter disease for age. Patchy foci of restricted diffusion involving the cortical and subcortical aspect of the right parieto-occipital region, consistent with acute to early subacute ischemic infarcts. No associated mass effect or significant hemorrhage. No other evidence for acute or subacute ischemia. Gray-white matter differentiation otherwise maintained. No other areas of chronic cortical infarction. No other acute or chronic intracranial blood products. No mass lesion, mass effect or midline shift. No hydrocephalus or extra-axial fluid collection. Pituitary gland suprasellar region within normal limits. Midline structures intact. Vascular: Major intracranial vascular flow voids are maintained. Skull and upper cervical spine: Craniocervical junction within normal limits. Bone marrow signal intensity normal. Asymmetric thickening of the right frontal calvarium noted. Scalp soft tissues demonstrate no acute finding. Sinuses/Orbits: Asymmetric right-sided proptosis. Diffuse prominence of the subcutaneous fat of the visualized right face. Scattered mucosal thickening noted about the ethmoidal air cells and maxillary sinuses. Small right mastoid effusion noted. Visualized nasopharynx unremarkable. Other: None. IMPRESSION: Patchy small volume acute to early subacute ischemic infarcts involving the cortical and subcortical aspect of the right parieto-occipital region. No associated mass effect or hemorrhage. Electronically Signed   By: Jeannine Boga M.D.   On: 11/25/2021 02:09   CT HEAD WO CONTRAST  Result Date: 11/24/2021 CLINICAL DATA:  Left arm weakness EXAM: CT HEAD WITHOUT CONTRAST TECHNIQUE: Contiguous axial images were obtained  from the base of the skull through the vertex without intravenous contrast. RADIATION DOSE REDUCTION: This exam was performed according to the departmental dose-optimization program which includes automated exposure control, adjustment of the mA and/or kV according to patient size and/or use of iterative reconstruction technique. COMPARISON:  None Available. FINDINGS: Brain: No acute territorial infarction, hemorrhage, or intracranial mass. Mild atrophy. The ventricles are nonenlarged. Vascular: No hyperdense vessels.  Carotid vascular calcification. Skull: No acute fracture. Postsurgical and posttraumatic changes of the right maxillary sinus and anterior zygomatic arch. Bony hypertrophy of the right zygomatic arch and partially visualized TMJ as well as the lateral right orbital wall, question due to prior trauma or potential fibrous dysplasia. Sinuses/Orbits: Mucosal thickening.  No acute osseous abnormality. Other: None IMPRESSION: 1. No CT evidence  for acute intracranial abnormality. 2. Mild atrophy Electronically Signed   By: Donavan Foil M.D.   On: 11/24/2021 19:56     Labs:   Basic Metabolic Panel: Recent Labs  Lab 11/24/21 1834 11/27/21 0142 11/29/21 0558  NA 137 138 137  K 4.2 3.9 4.1  CL 105 103 102  CO2 '23 26 22  '$ GLUCOSE 126* 127* 140*  BUN '14 15 14  '$ CREATININE 1.03 0.86 0.84  CALCIUM 9.3 9.2 9.1  MG  --  2.2  --    GFR Estimated Creatinine Clearance: 86.6 mL/min (by C-G formula based on SCr of 0.84 mg/dL). Liver Function Tests: Recent Labs  Lab 11/24/21 1834  AST 18  ALT 22  ALKPHOS 65  BILITOT 0.4  PROT 6.7  ALBUMIN 3.6   No results for input(s): "LIPASE", "AMYLASE" in the last 168 hours. No results for input(s): "AMMONIA" in the last 168 hours. Coagulation profile Recent Labs  Lab 11/24/21 1834  INR 1.1    CBC: Recent Labs  Lab 11/24/21 1834 11/27/21 0142 11/29/21 0558  WBC 9.4 9.6 13.2*  NEUTROABS 5.6  --   --   HGB 14.8 14.7 13.6  HCT 43.8 42.7  39.2  MCV 91.8 89.1 88.5  PLT 276 286 307   Cardiac Enzymes: No results for input(s): "CKTOTAL", "CKMB", "CKMBINDEX", "TROPONINI" in the last 168 hours. BNP: Invalid input(s): "POCBNP" CBG: No results for input(s): "GLUCAP" in the last 168 hours. D-Dimer No results for input(s): "DDIMER" in the last 72 hours. Hgb A1c No results for input(s): "HGBA1C" in the last 72 hours. Lipid Profile No results for input(s): "CHOL", "HDL", "LDLCALC", "TRIG", "CHOLHDL", "LDLDIRECT" in the last 72 hours. Thyroid function studies No results for input(s): "TSH", "T4TOTAL", "T3FREE", "THYROIDAB" in the last 72 hours.  Invalid input(s): "FREET3" Anemia work up No results for input(s): "VITAMINB12", "FOLATE", "FERRITIN", "TIBC", "IRON", "RETICCTPCT" in the last 72 hours. Microbiology Recent Results (from the past 240 hour(s))  Surgical pcr screen     Status: Abnormal   Collection Time: 11/27/21 10:16 PM   Specimen: Nasal Mucosa; Nasal Swab  Result Value Ref Range Status   MRSA, PCR NEGATIVE NEGATIVE Final   Staphylococcus aureus POSITIVE (A) NEGATIVE Final    Comment: (NOTE) The Xpert SA Assay (FDA approved for NASAL specimens in patients 37 years of age and older), is one component of a comprehensive surveillance program. It is not intended to diagnose infection nor to guide or monitor treatment. Performed at [facility], [place] [address], [place], [place] [address]      Discharge Instructions:   Discharge Instructions     Ambulatory referral to Neurology   Complete by: As directed    Follow up with stroke clinic NP (Abagale Boulos Vanschaick or Cecille Rubin, if both not available, consider Zachery Dauer, or [place]) at [facility] in about 4 weeks. Thanks.   Diet - low sodium heart healthy   Complete by: As directed    Increase activity slowly   Complete by: As directed    No wound care   Complete by: As directed       Allergies as of 11/29/2021       Reactions   Other Hives   NUTS         Medication List     STOP taking these medications    Meloxicam 10 MG Caps       TAKE these medications    aspirin EC 81 MG tablet Take 1 tablet (81 mg  total) by mouth daily. Swallow whole.   clopidogrel 75 MG tablet Commonly known as: PLAVIX Take 1 tablet (75 mg total) by mouth daily.   gabapentin 600 MG tablet Commonly known as: NEURONTIN Take 600 mg by mouth daily as needed (For pain).   lisinopril 20 MG tablet Commonly known as: ZESTRIL Take 20 mg by mouth daily.   oxyCODONE-acetaminophen 5-325 MG tablet Commonly known as: Percocet Take 1 tablet by mouth every 6 (six) hours as needed for severe pain.   pantoprazole 40 MG tablet Commonly known as: PROTONIX Take 1 tablet (40 mg total) by mouth daily.   rosuvastatin 20 MG tablet Commonly known as: CRESTOR Take 1 tablet (20 mg total) by mouth daily.        Follow-up Information     [facility]. Schedule an appointment as soon as possible for a visit in 1 month(s).   Specialty: Neurology Why: stroke clinic Contact information: [place] [phone]        [facility] Follow up in 4 week(s).   Specialty: Vascular Surgery Why: Office will call you to arrange your appt (sent) Contact information: [address] [place], [place], [place] Follow up in 1 week(s).   Specialty: Physician Assistant Contact information: [place] [id number] [phone]                  Time coordinating discharge: 45 min  Signed:  Geradine Girt DO  [facility] 11/29/2021, 9:52 AM

## 2021-11-29 NOTE — TOC Transition Note (Signed)
Transition of Care [facility]) - CM/SW Discharge Note   Patient Details  Name: Jay Kelly MRN: [id number] Date of Birth: [birth year]  [facility] CM/SW Contact:  Konrad Penta, RN Phone Number: [phone] 11/29/2021, 10:41 AM   Clinical Narrative:  Spoke with [name] prior to discharge. He states he does not need outpatient therapy arrangements or home health. States he is up walking around. States his daughter is supposed to pick him up today. Denies having any TOC needs.    Final next level of care: Home/Self Care Barriers to Discharge: No Barriers Identified   Patient Goals and CMS Choice Patient states their goals for this hospitalization and ongoing recovery are:: return home      Discharge Placement                       Discharge Plan and Services   Discharge Planning Services: CM Consult                                 Social Determinants of Health (SDOH) Interventions     Readmission Risk Interventions     No data to display

## 2021-11-29 NOTE — Progress Notes (Addendum)
  Progress Note    11/29/2021 8:08 AM 1 Day Post-Op  Subjective:  doing well.  Up and about in his room.  Says his numbness in his arm is getting progressively better.   Afebrile HR 50's-70's   Vitals:   11/28/21 2341 11/29/21 0346  BP: (!) 156/83 119/79  Pulse: 65 60  Resp: 19 13  Temp: 98.4 F (36.9 C) 98 F (36.7 C)  SpO2:      Physical Exam: General  no distress Lungs:  non labored Incisions:  right neck and left groin looks good without hematoma Extremities:  bilateral hand grips equal bilaterally   CBC    Component Value Date/Time   WBC 13.2 (H) 11/29/2021 0558   RBC 4.43 11/29/2021 0558   HGB 13.6 11/29/2021 0558   HCT 39.2 11/29/2021 0558   PLT 307 11/29/2021 0558   MCV 88.5 11/29/2021 0558   MCH 30.7 11/29/2021 0558   MCHC 34.7 11/29/2021 0558   RDW 12.3 11/29/2021 0558   LYMPHSABS 2.5 11/24/2021 1834   MONOABS 0.6 11/24/2021 1834   EOSABS 0.6 (H) 11/24/2021 1834   BASOSABS 0.1 11/24/2021 1834    BMET    Component Value Date/Time   NA 137 11/29/2021 0558   K 4.1 11/29/2021 0558   CL 102 11/29/2021 0558   CO2 22 11/29/2021 0558   GLUCOSE 140 (H) 11/29/2021 0558   BUN 14 11/29/2021 0558   CREATININE 0.84 11/29/2021 0558   CALCIUM 9.1 11/29/2021 0558   GFRNONAA >60 11/29/2021 0558   GFRAA 32 (L) 10/17/2017 1540    INR    Component Value Date/Time   INR 1.1 11/24/2021 1834     Intake/Output Summary (Last 24 hours) at 11/29/2021 0808 Last data filed at 11/28/2021 1939 Gross per 24 hour  Intake 1200 ml  Output 445 ml  Net 755 ml     Assessment/Plan:  66 y.o. male is s/p:  Right TCAR  1 Day Post-Op   -pt doing well from surgery standpoint. -continue asa/plavix/statin.  Discussed with pt and he knows it is imperative to continues these medications.  -f/u in 4 weeks with carotid duplex-our office will arrange appt.  -percocet #8 no refill sent to his pharmacy.   Leontine Locket, PA-C [facility] [phone] 11/29/2021 8:08 AM  I agree with the above.  I seen and evaluated the patient.  He is postoperative day #1, status post right TCAR.  He is neurologically intact.  His incision is healing nicely.  He needs to remain on dual antiplatelet therapy and statin therapy.  He can be discharged from our perspective.  He will follow-up in the office in a few weeks.  Annamarie Major

## 2021-12-01 ENCOUNTER — Encounter (HOSPITAL_COMMUNITY): Payer: Self-pay | Admitting: Vascular Surgery

## 2021-12-02 DIAGNOSIS — E785 Hyperlipidemia, unspecified: Secondary | ICD-10-CM | POA: Diagnosis not present

## 2021-12-02 DIAGNOSIS — M2550 Pain in unspecified joint: Secondary | ICD-10-CM | POA: Diagnosis not present

## 2021-12-02 DIAGNOSIS — N528 Other male erectile dysfunction: Secondary | ICD-10-CM | POA: Diagnosis not present

## 2021-12-02 DIAGNOSIS — I10 Essential (primary) hypertension: Secondary | ICD-10-CM | POA: Diagnosis not present

## 2021-12-02 DIAGNOSIS — Z125 Encounter for screening for malignant neoplasm of prostate: Secondary | ICD-10-CM | POA: Diagnosis not present

## 2021-12-02 DIAGNOSIS — Z0001 Encounter for general adult medical examination with abnormal findings: Secondary | ICD-10-CM | POA: Diagnosis not present

## 2021-12-02 DIAGNOSIS — G629 Polyneuropathy, unspecified: Secondary | ICD-10-CM | POA: Diagnosis not present

## 2021-12-02 DIAGNOSIS — F418 Other specified anxiety disorders: Secondary | ICD-10-CM | POA: Diagnosis not present

## 2021-12-02 DIAGNOSIS — Z720 Tobacco use: Secondary | ICD-10-CM | POA: Diagnosis not present

## 2021-12-03 ENCOUNTER — Telehealth: Payer: Self-pay | Admitting: Vascular Surgery

## 2021-12-03 NOTE — Telephone Encounter (Signed)
-----   Message from Gabriel Earing, [place] sent at 11/28/2021 12:41 PM EDT ----- S/p right TCAR 7/14.  F/u in 4 weeks with duplex on Dr. Donzetta Matters clinic day.  Thanks

## 2021-12-10 ENCOUNTER — Other Ambulatory Visit: Payer: Self-pay | Admitting: *Deleted

## 2021-12-10 DIAGNOSIS — I63239 Cerebral infarction due to unspecified occlusion or stenosis of unspecified carotid arteries: Secondary | ICD-10-CM

## 2022-01-07 ENCOUNTER — Encounter (HOSPITAL_COMMUNITY): Payer: Medicare Other

## 2022-01-13 ENCOUNTER — Inpatient Hospital Stay: Payer: Self-pay | Admitting: Family Medicine

## 2022-03-31 ENCOUNTER — Emergency Department (HOSPITAL_COMMUNITY): Payer: Medicare Other

## 2022-03-31 ENCOUNTER — Inpatient Hospital Stay (HOSPITAL_COMMUNITY)
Admission: EM | Admit: 2022-03-31 | Discharge: 2022-04-03 | DRG: 200 | Disposition: A | Discharge status: Home / Self Care | Payer: Medicare Other | Attending: General Surgery | Admitting: General Surgery

## 2022-03-31 ENCOUNTER — Observation Stay (HOSPITAL_COMMUNITY): Payer: Medicare Other

## 2022-03-31 ENCOUNTER — Other Ambulatory Visit: Payer: Self-pay

## 2022-03-31 ENCOUNTER — Encounter (HOSPITAL_COMMUNITY): Payer: Self-pay

## 2022-03-31 DIAGNOSIS — J90 Pleural effusion, not elsewhere classified: Secondary | ICD-10-CM | POA: Diagnosis not present

## 2022-03-31 DIAGNOSIS — J939 Pneumothorax, unspecified: Secondary | ICD-10-CM | POA: Diagnosis present

## 2022-03-31 DIAGNOSIS — J948 Other specified pleural conditions: Secondary | ICD-10-CM | POA: Diagnosis not present

## 2022-03-31 DIAGNOSIS — S42002A Fracture of unspecified part of left clavicle, initial encounter for closed fracture: Secondary | ICD-10-CM | POA: Diagnosis not present

## 2022-03-31 DIAGNOSIS — R4587 Impulsiveness: Secondary | ICD-10-CM | POA: Diagnosis present

## 2022-03-31 DIAGNOSIS — S270XXA Traumatic pneumothorax, initial encounter: Secondary | ICD-10-CM | POA: Diagnosis not present

## 2022-03-31 DIAGNOSIS — J3489 Other specified disorders of nose and nasal sinuses: Secondary | ICD-10-CM | POA: Diagnosis not present

## 2022-03-31 DIAGNOSIS — D1809 Hemangioma of other sites: Secondary | ICD-10-CM | POA: Diagnosis present

## 2022-03-31 DIAGNOSIS — S42032A Displaced fracture of lateral end of left clavicle, initial encounter for closed fracture: Secondary | ICD-10-CM | POA: Diagnosis not present

## 2022-03-31 DIAGNOSIS — S0240CA Maxillary fracture, right side, initial encounter for closed fracture: Secondary | ICD-10-CM | POA: Diagnosis not present

## 2022-03-31 DIAGNOSIS — I10 Essential (primary) hypertension: Secondary | ICD-10-CM | POA: Diagnosis present

## 2022-03-31 DIAGNOSIS — Z8673 Personal history of transient ischemic attack (TIA), and cerebral infarction without residual deficits: Secondary | ICD-10-CM

## 2022-03-31 DIAGNOSIS — R0781 Pleurodynia: Secondary | ICD-10-CM | POA: Diagnosis not present

## 2022-03-31 DIAGNOSIS — F10229 Alcohol dependence with intoxication, unspecified: Secondary | ICD-10-CM | POA: Diagnosis present

## 2022-03-31 DIAGNOSIS — Z823 Family history of stroke: Secondary | ICD-10-CM

## 2022-03-31 DIAGNOSIS — Z560 Unemployment, unspecified: Secondary | ICD-10-CM

## 2022-03-31 DIAGNOSIS — S0101XA Laceration without foreign body of scalp, initial encounter: Secondary | ICD-10-CM | POA: Diagnosis present

## 2022-03-31 DIAGNOSIS — T797XXA Traumatic subcutaneous emphysema, initial encounter: Secondary | ICD-10-CM | POA: Diagnosis present

## 2022-03-31 DIAGNOSIS — R0689 Other abnormalities of breathing: Secondary | ICD-10-CM | POA: Diagnosis not present

## 2022-03-31 DIAGNOSIS — S199XXA Unspecified injury of neck, initial encounter: Secondary | ICD-10-CM | POA: Diagnosis not present

## 2022-03-31 DIAGNOSIS — Z7982 Long term (current) use of aspirin: Secondary | ICD-10-CM

## 2022-03-31 DIAGNOSIS — Z91018 Allergy to other foods: Secondary | ICD-10-CM

## 2022-03-31 DIAGNOSIS — S0240EA Zygomatic fracture, right side, initial encounter for closed fracture: Secondary | ICD-10-CM | POA: Diagnosis not present

## 2022-03-31 DIAGNOSIS — F17210 Nicotine dependence, cigarettes, uncomplicated: Secondary | ICD-10-CM | POA: Diagnosis present

## 2022-03-31 DIAGNOSIS — Y92009 Unspecified place in unspecified non-institutional (private) residence as the place of occurrence of the external cause: Secondary | ICD-10-CM

## 2022-03-31 DIAGNOSIS — I959 Hypotension, unspecified: Secondary | ICD-10-CM | POA: Diagnosis not present

## 2022-03-31 DIAGNOSIS — S0181XA Laceration without foreign body of other part of head, initial encounter: Secondary | ICD-10-CM | POA: Diagnosis present

## 2022-03-31 DIAGNOSIS — M549 Dorsalgia, unspecified: Secondary | ICD-10-CM | POA: Diagnosis not present

## 2022-03-31 DIAGNOSIS — S3991XA Unspecified injury of abdomen, initial encounter: Secondary | ICD-10-CM | POA: Diagnosis not present

## 2022-03-31 DIAGNOSIS — S42125A Nondisplaced fracture of acromial process, left shoulder, initial encounter for closed fracture: Secondary | ICD-10-CM | POA: Diagnosis present

## 2022-03-31 DIAGNOSIS — T45526A Underdosing of antithrombotic drugs, initial encounter: Secondary | ICD-10-CM | POA: Diagnosis present

## 2022-03-31 DIAGNOSIS — Z7902 Long term (current) use of antithrombotics/antiplatelets: Secondary | ICD-10-CM

## 2022-03-31 DIAGNOSIS — S2232XA Fracture of one rib, left side, initial encounter for closed fracture: Secondary | ICD-10-CM | POA: Diagnosis present

## 2022-03-31 DIAGNOSIS — E7800 Pure hypercholesterolemia, unspecified: Secondary | ICD-10-CM | POA: Diagnosis present

## 2022-03-31 DIAGNOSIS — T1490XA Injury, unspecified, initial encounter: Secondary | ICD-10-CM

## 2022-03-31 DIAGNOSIS — Z9889 Other specified postprocedural states: Secondary | ICD-10-CM | POA: Diagnosis not present

## 2022-03-31 DIAGNOSIS — W19XXXA Unspecified fall, initial encounter: Secondary | ICD-10-CM | POA: Diagnosis present

## 2022-03-31 DIAGNOSIS — Y908 Blood alcohol level of 240 mg/100 ml or more: Secondary | ICD-10-CM | POA: Diagnosis present

## 2022-03-31 DIAGNOSIS — S3993XA Unspecified injury of pelvis, initial encounter: Secondary | ICD-10-CM | POA: Diagnosis not present

## 2022-03-31 DIAGNOSIS — T68XXXA Hypothermia, initial encounter: Secondary | ICD-10-CM | POA: Diagnosis not present

## 2022-03-31 DIAGNOSIS — M797 Fibromyalgia: Secondary | ICD-10-CM | POA: Diagnosis present

## 2022-03-31 DIAGNOSIS — J439 Emphysema, unspecified: Secondary | ICD-10-CM | POA: Diagnosis not present

## 2022-03-31 DIAGNOSIS — S2242XA Multiple fractures of ribs, left side, initial encounter for closed fracture: Secondary | ICD-10-CM | POA: Diagnosis not present

## 2022-03-31 LAB — CBC
HCT: 50.3 % (ref 39.0–52.0)
HCT: 46.3 % (ref 39.0–52.0)
Hemoglobin: 16.3 g/dL (ref 13.0–17.0)
Hemoglobin: 14.9 g/dL (ref 13.0–17.0)
MCH: 30.8 pg (ref 26.0–34.0)
MCH: 30.3 pg (ref 26.0–34.0)
MCHC: 32.4 g/dL (ref 30.0–36.0)
MCHC: 32.2 g/dL (ref 30.0–36.0)
MCV: 94.9 fL (ref 80.0–100.0)
MCV: 94.1 fL (ref 80.0–100.0)
Platelets: 378 10*3/uL (ref 150–400)
Platelets: 324 10*3/uL (ref 150–400)
RBC: 5.3 MIL/uL (ref 4.22–5.81)
RBC: 4.92 MIL/uL (ref 4.22–5.81)
RDW: 13.2 % (ref 11.5–15.5)
RDW: 13.1 % (ref 11.5–15.5)
WBC: 12.5 10*3/uL — ABNORMAL HIGH (ref 4.0–10.5)
WBC: 16.2 10*3/uL — ABNORMAL HIGH (ref 4.0–10.5)
nRBC: 0 % (ref 0.0–0.2)
nRBC: 0 % (ref 0.0–0.2)

## 2022-03-31 LAB — I-STAT CHEM 8, ED
BUN: 14 mg/dL (ref 8–23)
Calcium, Ion: 1.05 mmol/L — ABNORMAL LOW (ref 1.15–1.40)
Chloride: 109 mmol/L (ref 98–111)
Creatinine, Ser: 1.4 mg/dL — ABNORMAL HIGH (ref 0.61–1.24)
Glucose, Bld: 113 mg/dL — ABNORMAL HIGH (ref 70–99)
HCT: 49 % (ref 39.0–52.0)
Hemoglobin: 16.7 g/dL (ref 13.0–17.0)
Potassium: 4.3 mmol/L (ref 3.5–5.1)
Sodium: 142 mmol/L (ref 135–145)
TCO2: 23 mmol/L (ref 22–32)

## 2022-03-31 LAB — URINALYSIS, ROUTINE W REFLEX MICROSCOPIC
Bilirubin Urine: NEGATIVE
Glucose, UA: NEGATIVE mg/dL
Hgb urine dipstick: NEGATIVE
Ketones, ur: NEGATIVE mg/dL
Nitrite: NEGATIVE
Protein, ur: NEGATIVE mg/dL
Specific Gravity, Urine: >1.046 — ABNORMAL HIGH (ref 1.005–1.030)
pH: 5 (ref 5.0–8.0)

## 2022-03-31 LAB — RAPID URINE DRUG SCREEN, HOSP PERFORMED
Amphetamines: NOT DETECTED
Barbiturates: NOT DETECTED
Benzodiazepines: NOT DETECTED
Cocaine: NOT DETECTED
Opiates: POSITIVE — AB
Tetrahydrocannabinol: NOT DETECTED

## 2022-03-31 LAB — COMPREHENSIVE METABOLIC PANEL
ALT: 31 U/L (ref 0–44)
AST: 26 U/L (ref 15–41)
Albumin: 4.2 g/dL (ref 3.5–5.0)
Alkaline Phosphatase: 68 U/L (ref 38–126)
Anion gap: 12 (ref 5–15)
BUN: 13 mg/dL (ref 8–23)
CO2: 22 mmol/L (ref 22–32)
Calcium: 9.2 mg/dL (ref 8.9–10.3)
Chloride: 108 mmol/L (ref 98–111)
Creatinine, Ser: 1.05 mg/dL (ref 0.61–1.24)
GFR, Estimated: >60 mL/min (ref >60)
Glucose, Bld: 115 mg/dL — ABNORMAL HIGH (ref 70–99)
Potassium: 4.4 mmol/L (ref 3.5–5.1)
Sodium: 142 mmol/L (ref 135–145)
Total Bilirubin: 0.6 mg/dL (ref 0.3–1.2)
Total Protein: 7.5 g/dL (ref 6.5–8.1)

## 2022-03-31 LAB — PROTIME-INR
INR: 1 (ref 0.8–1.2)
Prothrombin Time: 13.4 seconds (ref 11.4–15.2)

## 2022-03-31 LAB — ETHANOL: Alcohol, Ethyl (B): 242 mg/dL — ABNORMAL HIGH (ref <10)

## 2022-03-31 MED ORDER — THIAMINE MONONITRATE 100 MG PO TABS
100.0000 mg | ORAL_TABLET | Freq: Every day | ORAL | Status: DC
  Administered 2022-03-31 – 2022-04-03 (×4): 100 mg via ORAL
  Filled 2022-03-31 (×4): qty 1

## 2022-03-31 MED ORDER — FOLIC ACID 1 MG PO TABS
1.0000 mg | ORAL_TABLET | Freq: Every day | ORAL | Status: DC
  Administered 2022-03-31 – 2022-04-03 (×4): 1 mg via ORAL
  Filled 2022-03-31 (×4): qty 1

## 2022-03-31 MED ORDER — ROSUVASTATIN CALCIUM 20 MG PO TABS
20.0000 mg | ORAL_TABLET | Freq: Every day | ORAL | Status: DC
  Administered 2022-03-31 – 2022-04-03 (×4): 20 mg via ORAL
  Filled 2022-03-31 (×4): qty 1

## 2022-03-31 MED ORDER — MORPHINE SULFATE (PF) 2 MG/ML IV SOLN
2.0000 mg | INTRAVENOUS | Status: DC | PRN
  Administered 2022-04-01: 2 mg via INTRAVENOUS
  Filled 2022-03-31: qty 1

## 2022-03-31 MED ORDER — METHOCARBAMOL 500 MG PO TABS
500.0000 mg | ORAL_TABLET | Freq: Three times a day (TID) | ORAL | Status: DC | PRN
  Administered 2022-03-31 – 2022-04-01 (×2): 500 mg via ORAL
  Filled 2022-03-31 (×2): qty 1

## 2022-03-31 MED ORDER — ENOXAPARIN SODIUM 30 MG/0.3ML IJ SOSY
30.0000 mg | PREFILLED_SYRINGE | Freq: Two times a day (BID) | INTRAMUSCULAR | Status: DC
  Administered 2022-04-01 – 2022-04-03 (×5): 30 mg via SUBCUTANEOUS
  Filled 2022-03-31 (×5): qty 0.3

## 2022-03-31 MED ORDER — DIPHENHYDRAMINE HCL 50 MG/ML IJ SOLN
50.0000 mg | Freq: Once | INTRAMUSCULAR | Status: AC
  Administered 2022-03-31: 50 mg via INTRAVENOUS
  Filled 2022-03-31: qty 1

## 2022-03-31 MED ORDER — LORAZEPAM 1 MG PO TABS: 1.0000 mg | ORAL_TABLET | ORAL | Status: DC | PRN

## 2022-03-31 MED ORDER — LISINOPRIL 20 MG PO TABS
20.0000 mg | ORAL_TABLET | Freq: Every day | ORAL | Status: DC
  Administered 2022-03-31 – 2022-04-03 (×4): 20 mg via ORAL
  Filled 2022-03-31 (×4): qty 1

## 2022-03-31 MED ORDER — SODIUM CHLORIDE 0.9 % IV SOLN: INTRAVENOUS | Status: DC

## 2022-03-31 MED ORDER — LORAZEPAM 2 MG/ML IJ SOLN: 1.0000 mg | INTRAMUSCULAR | Status: DC | PRN

## 2022-03-31 MED ORDER — METOPROLOL TARTRATE 5 MG/5ML IV SOLN
5.0000 mg | Freq: Four times a day (QID) | INTRAVENOUS | Status: DC | PRN
  Administered 2022-04-03: 5 mg via INTRAVENOUS
  Filled 2022-03-31: qty 5

## 2022-03-31 MED ORDER — TETANUS-DIPHTH-ACELL PERTUSSIS 5-2.5-18.5 LF-MCG/0.5 IM SUSY
0.5000 mL | PREFILLED_SYRINGE | Freq: Once | INTRAMUSCULAR | Status: DC
  Filled 2022-03-31: qty 0.5

## 2022-03-31 MED ORDER — IOHEXOL 350 MG/ML SOLN
70.0000 mL | Freq: Once | INTRAVENOUS | Status: AC | PRN
  Administered 2022-03-31: 70 mL via INTRAVENOUS

## 2022-03-31 MED ORDER — MORPHINE SULFATE (PF) 4 MG/ML IV SOLN
4.0000 mg | Freq: Once | INTRAVENOUS | Status: AC
  Administered 2022-03-31: 4 mg via INTRAVENOUS
  Filled 2022-03-31: qty 1

## 2022-03-31 MED ORDER — GABAPENTIN 600 MG PO TABS
300.0000 mg | ORAL_TABLET | Freq: Three times a day (TID) | ORAL | Status: DC
  Administered 2022-03-31 – 2022-04-01 (×4): 300 mg via ORAL
  Filled 2022-03-31 (×4): qty 1

## 2022-03-31 MED ORDER — CEFAZOLIN SODIUM-DEXTROSE 2-4 GM/100ML-% IV SOLN
2.0000 g | Freq: Once | INTRAVENOUS | Status: AC
  Administered 2022-03-31: 2 g via INTRAVENOUS
  Filled 2022-03-31: qty 100

## 2022-03-31 MED ORDER — ASPIRIN 81 MG PO TBEC
81.0000 mg | DELAYED_RELEASE_TABLET | Freq: Every day | ORAL | Status: DC
  Administered 2022-03-31 – 2022-04-03 (×4): 81 mg via ORAL
  Filled 2022-03-31 (×4): qty 1

## 2022-03-31 MED ORDER — METHOCARBAMOL 1000 MG/10ML IJ SOLN
500.0000 mg | Freq: Three times a day (TID) | INTRAVENOUS | Status: DC | PRN
  Filled 2022-03-31: qty 5

## 2022-03-31 MED ORDER — DOCUSATE SODIUM 100 MG PO CAPS
100.0000 mg | ORAL_CAPSULE | Freq: Two times a day (BID) | ORAL | Status: DC
  Administered 2022-03-31 – 2022-04-03 (×7): 100 mg via ORAL
  Filled 2022-03-31 (×7): qty 1

## 2022-03-31 MED ORDER — ONDANSETRON 4 MG PO TBDP: 4.0000 mg | ORAL_TABLET | Freq: Four times a day (QID) | ORAL | Status: DC | PRN

## 2022-03-31 MED ORDER — THIAMINE HCL 100 MG/ML IJ SOLN: 100.0000 mg | Freq: Every day | INTRAMUSCULAR | Status: DC

## 2022-03-31 MED ORDER — HALOPERIDOL LACTATE 5 MG/ML IJ SOLN
5.0000 mg | Freq: Once | INTRAMUSCULAR | Status: AC
  Administered 2022-03-31: 5 mg via INTRAVENOUS
  Filled 2022-03-31: qty 1

## 2022-03-31 MED ORDER — BACITRACIN ZINC 500 UNIT/GM EX OINT
TOPICAL_OINTMENT | Freq: Two times a day (BID) | CUTANEOUS | Status: DC
  Administered 2022-03-31 (×2): 1 via TOPICAL
  Filled 2022-03-31: qty 1.8
  Filled 2022-03-31: qty 28.4

## 2022-03-31 MED ORDER — ADULT MULTIVITAMIN W/MINERALS CH
1.0000 | ORAL_TABLET | Freq: Every day | ORAL | Status: DC
  Administered 2022-03-31 – 2022-04-03 (×4): 1 via ORAL
  Filled 2022-03-31 (×4): qty 1

## 2022-03-31 MED ORDER — ACETAMINOPHEN 500 MG PO TABS
1000.0000 mg | ORAL_TABLET | Freq: Four times a day (QID) | ORAL | Status: DC
  Administered 2022-03-31 – 2022-04-03 (×10): 1000 mg via ORAL
  Filled 2022-03-31 (×10): qty 2

## 2022-03-31 MED ORDER — PANTOPRAZOLE SODIUM 40 MG PO TBEC
40.0000 mg | DELAYED_RELEASE_TABLET | Freq: Every day | ORAL | Status: DC
  Administered 2022-03-31 – 2022-04-03 (×4): 40 mg via ORAL
  Filled 2022-03-31 (×4): qty 1

## 2022-03-31 MED ORDER — OXYCODONE HCL 5 MG PO TABS
5.0000 mg | ORAL_TABLET | ORAL | Status: DC | PRN
  Administered 2022-03-31 – 2022-04-03 (×13): 10 mg via ORAL
  Filled 2022-03-31 (×13): qty 2

## 2022-03-31 MED ORDER — ONDANSETRON HCL 4 MG/2ML IJ SOLN: 4.0000 mg | Freq: Four times a day (QID) | INTRAMUSCULAR | Status: DC | PRN
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
## 2022-03-31 NOTE — TOC CAGE-AID Note (Signed)
Transition of Care [facility]) - CAGE-AID Screening   Patient Details  Name: Jay Kelly MRN: [id number] Date of Birth: [birth year]  [facility] CM/SW Contact:    Trudee Kuster, RN Phone Number: 03/31/2022, 7:23 PM   Clinical Narrative: Patient refusing resources at this time.   CAGE-AID Screening:    Have You Ever Felt You Ought to Cut Down on Your Drinking or Drug Use?: No Have People Annoyed You By Critizing Your Drinking Or Drug Use?: No Have You Felt Bad Or Guilty About Your Drinking Or Drug Use?: No Have You Ever Had a Drink or Used Drugs First Thing In The Morning to Steady Your Nerves or to Get Rid of a Hangover?: No CAGE-AID Score: 0  Substance Abuse Education Offered: No
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
## 2022-03-31 NOTE — Progress Notes (Signed)
Orthopedic Tech Progress Note Patient Details:  Jay Kelly [birth year] [id number]  Ortho Devices Type of Ortho Device: Shoulder immobilizer Ortho Device/Splint Location: LUE Ortho Device/Splint Interventions: Application, Ordered, Adjustment   Post Interventions Patient Tolerated: Well Instructions Provided: Care of device  [name] 03/31/2022, 3:29 PM
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
## 2022-03-31 NOTE — ED Notes (Signed)
Patient transported to CT 

## 2022-04-01 ENCOUNTER — Observation Stay (HOSPITAL_COMMUNITY): Payer: Medicare Other

## 2022-04-01 DIAGNOSIS — T45526A Underdosing of antithrombotic drugs, initial encounter: Secondary | ICD-10-CM | POA: Diagnosis present

## 2022-04-01 DIAGNOSIS — W19XXXA Unspecified fall, initial encounter: Secondary | ICD-10-CM | POA: Diagnosis present

## 2022-04-01 DIAGNOSIS — J939 Pneumothorax, unspecified: Secondary | ICD-10-CM | POA: Diagnosis present

## 2022-04-01 DIAGNOSIS — Z7902 Long term (current) use of antithrombotics/antiplatelets: Secondary | ICD-10-CM | POA: Diagnosis not present

## 2022-04-01 DIAGNOSIS — Y908 Blood alcohol level of 240 mg/100 ml or more: Secondary | ICD-10-CM | POA: Diagnosis present

## 2022-04-01 DIAGNOSIS — Y92009 Unspecified place in unspecified non-institutional (private) residence as the place of occurrence of the external cause: Secondary | ICD-10-CM | POA: Diagnosis not present

## 2022-04-01 DIAGNOSIS — S270XXA Traumatic pneumothorax, initial encounter: Secondary | ICD-10-CM | POA: Diagnosis present

## 2022-04-01 DIAGNOSIS — I10 Essential (primary) hypertension: Secondary | ICD-10-CM | POA: Diagnosis present

## 2022-04-01 DIAGNOSIS — R0602 Shortness of breath: Secondary | ICD-10-CM | POA: Diagnosis not present

## 2022-04-01 DIAGNOSIS — Z823 Family history of stroke: Secondary | ICD-10-CM | POA: Diagnosis not present

## 2022-04-01 DIAGNOSIS — J948 Other specified pleural conditions: Secondary | ICD-10-CM | POA: Diagnosis present

## 2022-04-01 DIAGNOSIS — M797 Fibromyalgia: Secondary | ICD-10-CM | POA: Diagnosis present

## 2022-04-01 DIAGNOSIS — S0181XA Laceration without foreign body of other part of head, initial encounter: Secondary | ICD-10-CM | POA: Diagnosis present

## 2022-04-01 DIAGNOSIS — E7800 Pure hypercholesterolemia, unspecified: Secondary | ICD-10-CM | POA: Diagnosis present

## 2022-04-01 DIAGNOSIS — S42125A Nondisplaced fracture of acromial process, left shoulder, initial encounter for closed fracture: Secondary | ICD-10-CM | POA: Diagnosis present

## 2022-04-01 DIAGNOSIS — D1809 Hemangioma of other sites: Secondary | ICD-10-CM | POA: Diagnosis present

## 2022-04-01 DIAGNOSIS — S0101XA Laceration without foreign body of scalp, initial encounter: Secondary | ICD-10-CM | POA: Diagnosis present

## 2022-04-01 DIAGNOSIS — F10229 Alcohol dependence with intoxication, unspecified: Secondary | ICD-10-CM | POA: Diagnosis present

## 2022-04-01 DIAGNOSIS — R4587 Impulsiveness: Secondary | ICD-10-CM | POA: Diagnosis present

## 2022-04-01 DIAGNOSIS — Z91018 Allergy to other foods: Secondary | ICD-10-CM | POA: Diagnosis not present

## 2022-04-01 DIAGNOSIS — J90 Pleural effusion, not elsewhere classified: Secondary | ICD-10-CM | POA: Diagnosis not present

## 2022-04-01 DIAGNOSIS — S2232XA Fracture of one rib, left side, initial encounter for closed fracture: Secondary | ICD-10-CM | POA: Diagnosis present

## 2022-04-01 DIAGNOSIS — T797XXA Traumatic subcutaneous emphysema, initial encounter: Secondary | ICD-10-CM | POA: Diagnosis present

## 2022-04-01 DIAGNOSIS — Z7982 Long term (current) use of aspirin: Secondary | ICD-10-CM | POA: Diagnosis not present

## 2022-04-01 DIAGNOSIS — S42002A Fracture of unspecified part of left clavicle, initial encounter for closed fracture: Secondary | ICD-10-CM | POA: Diagnosis present

## 2022-04-01 DIAGNOSIS — Z560 Unemployment, unspecified: Secondary | ICD-10-CM | POA: Diagnosis not present

## 2022-04-01 DIAGNOSIS — Z8673 Personal history of transient ischemic attack (TIA), and cerebral infarction without residual deficits: Secondary | ICD-10-CM | POA: Diagnosis not present

## 2022-04-01 DIAGNOSIS — F17210 Nicotine dependence, cigarettes, uncomplicated: Secondary | ICD-10-CM | POA: Diagnosis present

## 2022-04-01 DIAGNOSIS — J9811 Atelectasis: Secondary | ICD-10-CM | POA: Diagnosis not present

## 2022-04-01 DIAGNOSIS — J439 Emphysema, unspecified: Secondary | ICD-10-CM | POA: Diagnosis not present

## 2022-04-01 LAB — CBC
HCT: 40.8 % (ref 39.0–52.0)
Hemoglobin: 13.2 g/dL (ref 13.0–17.0)
MCH: 30.3 pg (ref 26.0–34.0)
MCHC: 32.4 g/dL (ref 30.0–36.0)
MCV: 93.8 fL (ref 80.0–100.0)
Platelets: 269 10*3/uL (ref 150–400)
RBC: 4.35 MIL/uL (ref 4.22–5.81)
RDW: 13.3 % (ref 11.5–15.5)
WBC: 10.9 10*3/uL — ABNORMAL HIGH (ref 4.0–10.5)
nRBC: 0 % (ref 0.0–0.2)

## 2022-04-01 LAB — COMPREHENSIVE METABOLIC PANEL
ALT: 30 U/L (ref 0–44)
AST: 45 U/L — ABNORMAL HIGH (ref 15–41)
Albumin: 3.3 g/dL — ABNORMAL LOW (ref 3.5–5.0)
Alkaline Phosphatase: 60 U/L (ref 38–126)
Anion gap: 8 (ref 5–15)
BUN: 14 mg/dL (ref 8–23)
CO2: 24 mmol/L (ref 22–32)
Calcium: 8.4 mg/dL — ABNORMAL LOW (ref 8.9–10.3)
Chloride: 105 mmol/L (ref 98–111)
Creatinine, Ser: 0.86 mg/dL (ref 0.61–1.24)
GFR, Estimated: >60 mL/min (ref >60)
Glucose, Bld: 117 mg/dL — ABNORMAL HIGH (ref 70–99)
Potassium: 3.6 mmol/L (ref 3.5–5.1)
Sodium: 137 mmol/L (ref 135–145)
Total Bilirubin: 0.6 mg/dL (ref 0.3–1.2)
Total Protein: 6 g/dL — ABNORMAL LOW (ref 6.5–8.1)

## 2022-04-01 MED ORDER — GABAPENTIN 300 MG PO CAPS
300.0000 mg | ORAL_CAPSULE | Freq: Three times a day (TID) | ORAL | Status: DC
  Administered 2022-04-01 – 2022-04-03 (×5): 300 mg via ORAL
  Filled 2022-04-01 (×5): qty 1

## 2022-04-01 MED ORDER — CLOPIDOGREL BISULFATE 75 MG PO TABS
75.0000 mg | ORAL_TABLET | Freq: Every day | ORAL | Status: DC
  Administered 2022-04-01 – 2022-04-03 (×3): 75 mg via ORAL
  Filled 2022-04-01 (×3): qty 1

## 2022-04-01 MED ORDER — ORAL CARE MOUTH RINSE: 15.0000 mL | OROMUCOSAL | Status: DC | PRN

## 2022-04-01 MED ORDER — ALBUTEROL SULFATE (2.5 MG/3ML) 0.083% IN NEBU
2.5000 mg | INHALATION_SOLUTION | Freq: Three times a day (TID) | RESPIRATORY_TRACT | Status: DC
  Administered 2022-04-01 – 2022-04-02 (×4): 2.5 mg via RESPIRATORY_TRACT
  Filled 2022-04-01 (×4): qty 3

## 2022-04-01 MED ORDER — KETOROLAC TROMETHAMINE 15 MG/ML IJ SOLN
15.0000 mg | Freq: Four times a day (QID) | INTRAMUSCULAR | Status: DC
  Administered 2022-04-01 – 2022-04-03 (×8): 15 mg via INTRAVENOUS
  Filled 2022-04-01 (×8): qty 1

## 2022-04-01 MED ORDER — METHOCARBAMOL 1000 MG/10ML IJ SOLN
500.0000 mg | Freq: Three times a day (TID) | INTRAVENOUS | Status: DC
  Filled 2022-04-01: qty 5

## 2022-04-01 MED ORDER — METHOCARBAMOL 500 MG PO TABS
1000.0000 mg | ORAL_TABLET | Freq: Three times a day (TID) | ORAL | Status: DC
  Administered 2022-04-01 – 2022-04-02 (×4): 1000 mg via ORAL
  Filled 2022-04-01 (×5): qty 2

## 2022-04-01 NOTE — Progress Notes (Signed)
Mobility Specialist Progress Note   04/01/22 1145  Mobility  Activity Transferred from chair to bed  Level of Assistance Standby assist, set-up cues, supervision of patient - no hands on  Assistive Device None  Range of Motion/Exercises Active;All extremities  LUE Weight Bearing NWB  Activity Response Tolerated well   Patient deferred ambulation in hallway for unspecified reasons. Was left in supine with with all needs met, call bell in reach.   [place] Jeet Shough, BS EXP Mobility Specialist Please contact via SecureChat or Rehab office at [phone]
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
## 2022-04-02 ENCOUNTER — Inpatient Hospital Stay (HOSPITAL_COMMUNITY): Payer: Medicare Other

## 2022-04-02 ENCOUNTER — Other Ambulatory Visit (HOSPITAL_COMMUNITY): Payer: Self-pay

## 2022-04-02 LAB — CBC
HCT: 39.5 % (ref 39.0–52.0)
Hemoglobin: 13.2 g/dL (ref 13.0–17.0)
MCH: 30.7 pg (ref 26.0–34.0)
MCHC: 33.4 g/dL (ref 30.0–36.0)
MCV: 91.9 fL (ref 80.0–100.0)
Platelets: 234 10*3/uL (ref 150–400)
RBC: 4.3 MIL/uL (ref 4.22–5.81)
RDW: 13 % (ref 11.5–15.5)
WBC: 10.1 10*3/uL (ref 4.0–10.5)
nRBC: 0 % (ref 0.0–0.2)

## 2022-04-02 MED ORDER — BACITRACIN ZINC 500 UNIT/GM EX OINT
TOPICAL_OINTMENT | Freq: Two times a day (BID) | CUTANEOUS | 0 refills | Status: AC
  Filled 2022-04-02: qty 28.4, 14d supply, fill #0

## 2022-04-02 MED ORDER — ALBUTEROL SULFATE (2.5 MG/3ML) 0.083% IN NEBU: 2.5000 mg | INHALATION_SOLUTION | Freq: Four times a day (QID) | RESPIRATORY_TRACT | Status: DC | PRN

## 2022-04-02 MED ORDER — ACETAMINOPHEN 500 MG PO TABS
1000.0000 mg | ORAL_TABLET | Freq: Four times a day (QID) | ORAL | 0 refills | Status: AC
  Filled 2022-04-02: qty 30, 4d supply, fill #0

## 2022-04-02 MED ORDER — METHOCARBAMOL 500 MG PO TABS
1000.0000 mg | ORAL_TABLET | Freq: Three times a day (TID) | ORAL | 0 refills | Status: AC | PRN
  Filled 2022-04-02: qty 50, 9d supply, fill #0

## 2022-04-02 MED ORDER — OXYCODONE HCL 10 MG PO TABS
10.0000 mg | ORAL_TABLET | Freq: Four times a day (QID) | ORAL | 0 refills | Status: AC | PRN
  Filled 2022-04-02: qty 20, 5d supply, fill #0

## 2022-04-02 MED ORDER — DOCUSATE SODIUM 100 MG PO CAPS
100.0000 mg | ORAL_CAPSULE | Freq: Two times a day (BID) | ORAL | 0 refills | Status: AC
  Filled 2022-04-02: qty 10, 5d supply, fill #0

## 2022-04-02 MED ORDER — CLOPIDOGREL BISULFATE 75 MG PO TABS
75.0000 mg | ORAL_TABLET | Freq: Every day | ORAL | 0 refills | Status: AC
  Filled 2022-04-02: qty 30, 30d supply, fill #0

## 2022-04-02 NOTE — Progress Notes (Signed)
Mobility Specialist: Progress Note   04/02/22 1500  Mobility  Activity Refused mobility   Pt refused mobility stating "I can get around fine. My arm is hurt, not my legs." Will f/u as able.   [place] Zaida Reiland Mobility Specialist Please contact via SecureChat or Rehab office at [phone]

## 2022-04-02 NOTE — Progress Notes (Signed)
PT Cancellation Note  Patient Details Name: Jay Kelly MRN: [id number] DOB: July 27, 1955   Cancelled Treatment:    Reason Eval/Treat Not Completed: Patient declined, no reason specified. PT attempted to see pt twice this morning, however pt declined both times. Pt reports he is back to his baseline and does not need any assistance with his walking or further PT services. On 2nd attempt pt found with sling wrapped around back, pt refuses assistance from PT to reposition sling, reports he will adjust it when he needs to and that he had moved it because he was hot. PT will attempt to follow up one additional time this afternoon.   Zenaida Niece 04/02/2022, 11:37 AM

## 2022-04-02 NOTE — Progress Notes (Signed)
Pt continuously non compliant with sling to L shoulder throughout shift states he will put it back on when he is ready. Pt educated continued to be non compliant.
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
## 2022-04-03 NOTE — Progress Notes (Signed)
Pt with d/c orders home. Assessment documented pt is stable. PIV removed. Prescriptions to be delivered from [facility] pt is aware. Discharge packet and education provided all questions answered. Pt transported via wheelchair to discharge lounge.

## 2022-04-03 NOTE — Progress Notes (Signed)
PT Cancellation Note  Patient Details Name: Jay Kelly MRN: [id number] DOB: [birth year]   Cancelled Treatment:    Reason Eval/Treat Not Completed: Patient declined, no reason specified. Pt refuses PT treatment again this morning, reporting he will walk soon when his ride gets here to pick him up. Pt also refuses assistance with donning sling, reporting he does not think he needs it but he will put it back on before he stands. PT will continue to follow, if pt refuses again PT will sign off.   Zenaida Niece 04/03/2022, 10:08 AM
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
